# Patient Record
Sex: Female | Born: 1990
Health system: Southern US, Community
[De-identification: ages and names within clinical notes are randomized; demographics above are authoritative.]

## PROBLEM LIST (undated history)

## (undated) DIAGNOSIS — F419 Anxiety disorder, unspecified: Secondary | ICD-10-CM

## (undated) DIAGNOSIS — F329 Major depressive disorder, single episode, unspecified: Secondary | ICD-10-CM

## (undated) DIAGNOSIS — E282 Polycystic ovarian syndrome: Secondary | ICD-10-CM

## (undated) DIAGNOSIS — F32A Depression, unspecified: Secondary | ICD-10-CM

## (undated) HISTORY — DX: Anxiety disorder, unspecified: F41.9

## (undated) HISTORY — DX: Depression, unspecified: F32.A

## (undated) HISTORY — DX: Major depressive disorder, single episode, unspecified: F32.9

## (undated) HISTORY — PX: BRAIN SURGERY: SHX531

## (undated) HISTORY — PX: WISDOM TOOTH EXTRACTION: SHX21

---

## 2008-07-15 ENCOUNTER — Encounter: Admission: RE | Admit: 2008-07-15 | Discharge: 2008-07-15 | Payer: Self-pay | Admitting: Family Medicine

## 2008-10-16 ENCOUNTER — Encounter: Admission: RE | Admit: 2008-10-16 | Discharge: 2008-10-16 | Payer: Self-pay | Admitting: Endocrinology

## 2011-04-07 ENCOUNTER — Emergency Department (HOSPITAL_COMMUNITY): Payer: No Typology Code available for payment source

## 2011-04-07 ENCOUNTER — Emergency Department (HOSPITAL_COMMUNITY)
Admission: EM | Admit: 2011-04-07 | Discharge: 2011-04-07 | Payer: No Typology Code available for payment source | Attending: Emergency Medicine | Admitting: Emergency Medicine

## 2011-04-07 ENCOUNTER — Encounter (HOSPITAL_COMMUNITY): Payer: Self-pay | Admitting: Surgery

## 2011-04-07 DIAGNOSIS — S27329A Contusion of lung, unspecified, initial encounter: Secondary | ICD-10-CM | POA: Insufficient documentation

## 2011-04-07 DIAGNOSIS — S064X9A Epidural hemorrhage with loss of consciousness of unspecified duration, initial encounter: Secondary | ICD-10-CM

## 2011-04-07 DIAGNOSIS — I621 Nontraumatic extradural hemorrhage: Secondary | ICD-10-CM | POA: Insufficient documentation

## 2011-04-07 DIAGNOSIS — R1110 Vomiting, unspecified: Secondary | ICD-10-CM | POA: Insufficient documentation

## 2011-04-07 DIAGNOSIS — S36113A Laceration of liver, unspecified degree, initial encounter: Secondary | ICD-10-CM

## 2011-04-07 LAB — TYPE AND SCREEN
ABO/RH(D): O POS
Antibody Screen: NEGATIVE
Unit division: 0
Unit division: 0

## 2011-04-07 LAB — CBC
HCT: 36.4 % (ref 36.0–46.0)
Hemoglobin: 12.4 g/dL (ref 12.0–15.0)
MCH: 29.2 pg (ref 26.0–34.0)
MCHC: 34.1 g/dL (ref 30.0–36.0)
MCV: 85.8 fL (ref 78.0–100.0)
Platelets: 304 10*3/uL (ref 150–400)
RBC: 4.24 MIL/uL (ref 3.87–5.11)
RDW: 13.1 % (ref 11.5–15.5)
WBC: 8.7 10*3/uL (ref 4.0–10.5)

## 2011-04-07 LAB — BASIC METABOLIC PANEL
BUN: 8 mg/dL (ref 6–23)
CO2: 23 mEq/L (ref 19–32)
Calcium: 8.6 mg/dL (ref 8.4–10.5)
Chloride: 101 mEq/L (ref 96–112)
Creatinine, Ser: 0.43 mg/dL — ABNORMAL LOW (ref 0.50–1.10)
GFR calc Af Amer: >90 mL/min (ref >90)
GFR calc non Af Amer: >90 mL/min (ref >90)
Glucose, Bld: 129 mg/dL — ABNORMAL HIGH (ref 70–99)
Potassium: 2.7 mEq/L — CL (ref 3.5–5.1)
Sodium: 138 mEq/L (ref 135–145)

## 2011-04-07 LAB — POCT I-STAT, CHEM 8
BUN: 7 mg/dL (ref 6–23)
Calcium, Ion: 1.08 mmol/L — ABNORMAL LOW (ref 1.12–1.32)
Chloride: 103 mEq/L (ref 96–112)
Creatinine, Ser: 0.7 mg/dL (ref 0.50–1.10)
Glucose, Bld: 132 mg/dL — ABNORMAL HIGH (ref 70–99)
HCT: 37 % (ref 36.0–46.0)
Hemoglobin: 12.6 g/dL (ref 12.0–15.0)
Potassium: 2.8 mEq/L — ABNORMAL LOW (ref 3.5–5.1)
Sodium: 140 mEq/L (ref 135–145)
TCO2: 22 mmol/L (ref 0–100)

## 2011-04-07 LAB — PROTIME-INR
INR: 1.03 (ref 0.00–1.49)
Prothrombin Time: 13.7 seconds (ref 11.6–15.2)

## 2011-04-07 LAB — ETHANOL: Alcohol, Ethyl (B): 53 mg/dL — ABNORMAL HIGH (ref 0–11)

## 2011-04-07 LAB — ABO/RH: ABO/RH(D): O POS

## 2011-04-07 MED ORDER — ROCURONIUM BROMIDE 50 MG/5ML IV SOLN
0.6000 mg/kg | Freq: Once | INTRAVENOUS | Status: AC
  Administered 2011-04-07: 10 mg via INTRAVENOUS

## 2011-04-07 MED ORDER — MIDAZOLAM HCL 2 MG/2ML IJ SOLN
6.0000 mg | Freq: Once | INTRAMUSCULAR | Status: AC
  Administered 2011-04-07: 6 mg via INTRAVENOUS

## 2011-04-07 MED ORDER — ONDANSETRON HCL 4 MG/2ML IJ SOLN
4.0000 mg | Freq: Once | INTRAMUSCULAR | Status: AC
  Administered 2011-04-07: 4 mg via INTRAVENOUS

## 2011-04-07 MED ORDER — HALOPERIDOL LACTATE 5 MG/ML IJ SOLN: 5.0000 mg | Freq: Once | INTRAMUSCULAR | Status: DC

## 2011-04-07 MED ORDER — LORAZEPAM 2 MG/ML IJ SOLN: 2.0000 mg | Freq: Once | INTRAMUSCULAR | Status: DC

## 2011-04-07 MED ORDER — FENTANYL CITRATE 0.05 MG/ML IJ SOLN
100.0000 ug | Freq: Once | INTRAMUSCULAR | Status: AC
  Administered 2011-04-07: 100 ug via INTRAVENOUS

## 2011-04-07 MED ORDER — HALOPERIDOL LACTATE 5 MG/ML IJ SOLN
INTRAMUSCULAR | Status: AC
  Administered 2011-04-07: 5 mg via INTRAVENOUS
  Filled 2011-04-07: qty 1

## 2011-04-07 MED ORDER — ETOMIDATE 2 MG/ML IV SOLN
25.0000 mg | Freq: Once | INTRAVENOUS | Status: AC
  Administered 2011-04-07: 20 mg via INTRAVENOUS

## 2011-04-07 MED ORDER — IOHEXOL 300 MG/ML  SOLN
100.0000 mL | Freq: Once | INTRAMUSCULAR | Status: AC | PRN
  Administered 2011-04-07: 100 mL via INTRAVENOUS

## 2011-04-07 NOTE — ED Notes (Signed)
Dr. Patria Mane arrives.

## 2011-04-07 NOTE — ED Notes (Signed)
Family arrived in ED waiting. Chaplain paged.
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
## 2011-04-07 NOTE — ED Notes (Signed)
Pharmacy called for propafol
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
## 2011-04-07 NOTE — ED Notes (Signed)
MD at bedside. 

## 2011-04-07 NOTE — ED Notes (Signed)
ems arrives for emergent transfer to [facility]

## 2011-04-07 NOTE — ED Notes (Signed)
Dr Davina Poke at bedside. Team prepares to intubate. Dr Patria Mane orders 20mg  etomodate to be drawn up and 70 of rocuronium
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
## 2011-04-07 NOTE — ED Notes (Signed)
Pt very combative. Dr Patria Mane orders 5 mg haldol iv and discontinue efforts on second iv for now.

## 2011-04-07 NOTE — ED Notes (Signed)
To ct

## 2011-04-07 NOTE — ED Notes (Signed)
Pt calm at this time. Scan underway. Dr Patria Mane advised hold ativan for now.

## 2011-04-07 NOTE — ED Notes (Signed)
Pt combative on CT table. Dr. Patria Mane called for orders. He advised he is en route.
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
## 2011-04-20 MED FILL — Lorazepam Inj 2 MG/ML: INTRAMUSCULAR | Qty: 1 | Status: AC
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
## 2014-06-26 ENCOUNTER — Ambulatory Visit: Payer: 59 | Admitting: Neurology

## 2015-02-12 ENCOUNTER — Ambulatory Visit: Payer: 59 | Admitting: Neurology

## 2015-05-05 ENCOUNTER — Telehealth: Payer: Self-pay | Admitting: *Deleted

## 2015-05-05 ENCOUNTER — Ambulatory Visit: Payer: 59 | Admitting: Neurology

## 2015-05-05 NOTE — Telephone Encounter (Signed)
No showed new patient appointment. 

## 2015-05-06 ENCOUNTER — Encounter: Payer: Self-pay | Admitting: Neurology

## 2015-06-05 ENCOUNTER — Emergency Department (HOSPITAL_COMMUNITY)
Admission: EM | Admit: 2015-06-05 | Discharge: 2015-06-06 | Disposition: A | Discharge status: Home / Self Care | Payer: BLUE CROSS/BLUE SHIELD | Attending: Emergency Medicine | Admitting: Emergency Medicine

## 2015-06-05 ENCOUNTER — Encounter (HOSPITAL_COMMUNITY): Payer: Self-pay

## 2015-06-05 ENCOUNTER — Emergency Department (HOSPITAL_COMMUNITY): Payer: BLUE CROSS/BLUE SHIELD

## 2015-06-05 DIAGNOSIS — Z841 Family history of disorders of kidney and ureter: Secondary | ICD-10-CM | POA: Insufficient documentation

## 2015-06-05 DIAGNOSIS — Z79899 Other long term (current) drug therapy: Secondary | ICD-10-CM | POA: Diagnosis not present

## 2015-06-05 DIAGNOSIS — Z793 Long term (current) use of hormonal contraceptives: Secondary | ICD-10-CM | POA: Insufficient documentation

## 2015-06-05 DIAGNOSIS — N132 Hydronephrosis with renal and ureteral calculous obstruction: Secondary | ICD-10-CM | POA: Insufficient documentation

## 2015-06-05 DIAGNOSIS — N200 Calculus of kidney: Secondary | ICD-10-CM

## 2015-06-05 DIAGNOSIS — E282 Polycystic ovarian syndrome: Secondary | ICD-10-CM | POA: Diagnosis not present

## 2015-06-05 DIAGNOSIS — R319 Hematuria, unspecified: Secondary | ICD-10-CM | POA: Diagnosis present

## 2015-06-05 DIAGNOSIS — R109 Unspecified abdominal pain: Secondary | ICD-10-CM

## 2015-06-05 HISTORY — DX: Polycystic ovarian syndrome: E28.2

## 2015-06-05 LAB — BASIC METABOLIC PANEL
Anion gap: 11 (ref 5–15)
BUN: 15 mg/dL (ref 6–20)
CO2: 26 mmol/L (ref 22–32)
Calcium: 9.5 mg/dL (ref 8.9–10.3)
Chloride: 102 mmol/L (ref 101–111)
Creatinine, Ser: 0.69 mg/dL (ref 0.44–1.00)
GFR calc Af Amer: >60 mL/min (ref >60)
GFR calc non Af Amer: >60 mL/min (ref >60)
Glucose, Bld: 85 mg/dL (ref 65–99)
Potassium: 4.1 mmol/L (ref 3.5–5.1)
Sodium: 139 mmol/L (ref 135–145)

## 2015-06-05 LAB — CBC WITH DIFFERENTIAL/PLATELET
Basophils Absolute: 0 10*3/uL (ref 0.0–0.1)
Basophils Relative: 0 %
Eosinophils Absolute: 0.1 10*3/uL (ref 0.0–0.7)
Eosinophils Relative: 1 %
HCT: 42.7 % (ref 36.0–46.0)
Hemoglobin: 14.4 g/dL (ref 12.0–15.0)
Lymphocytes Relative: 26 %
Lymphs Abs: 1.9 10*3/uL (ref 0.7–4.0)
MCH: 29.4 pg (ref 26.0–34.0)
MCHC: 33.7 g/dL (ref 30.0–36.0)
MCV: 87.1 fL (ref 78.0–100.0)
Monocytes Absolute: 0.7 10*3/uL (ref 0.1–1.0)
Monocytes Relative: 10 %
Neutro Abs: 4.6 10*3/uL (ref 1.7–7.7)
Neutrophils Relative %: 63 %
Platelets: 302 10*3/uL (ref 150–400)
RBC: 4.9 MIL/uL (ref 3.87–5.11)
RDW: 13 % (ref 11.5–15.5)
WBC: 7.3 10*3/uL (ref 4.0–10.5)

## 2015-06-05 LAB — URINALYSIS, ROUTINE W REFLEX MICROSCOPIC
Glucose, UA: NEGATIVE mg/dL
Hgb urine dipstick: NEGATIVE
Ketones, ur: 15 mg/dL — AB
Nitrite: POSITIVE — AB
Protein, ur: NEGATIVE mg/dL
Specific Gravity, Urine: 1.015 (ref 1.005–1.030)
pH: 5 (ref 5.0–8.0)

## 2015-06-05 LAB — URINE MICROSCOPIC-ADD ON

## 2015-06-05 LAB — I-STAT BETA HCG BLOOD, ED (MC, WL, AP ONLY): I-stat hCG, quantitative: <5 m[IU]/mL (ref <5)

## 2015-06-05 MED ORDER — KETOROLAC TROMETHAMINE 30 MG/ML IJ SOLN
15.0000 mg | Freq: Once | INTRAMUSCULAR | Status: AC
  Administered 2015-06-05: 15 mg via INTRAVENOUS
  Filled 2015-06-05: qty 1

## 2015-06-05 MED ORDER — KETOROLAC TROMETHAMINE 30 MG/ML IJ SOLN: 15.0000 mg | Freq: Once | INTRAMUSCULAR | Status: DC

## 2015-06-05 MED ORDER — HYDROMORPHONE HCL 2 MG/ML IJ SOLN
2.0000 mg | INTRAMUSCULAR | Status: DC | PRN
  Administered 2015-06-05: 1 mg via INTRAVENOUS
  Administered 2015-06-05: 2 mg via INTRAVENOUS
  Filled 2015-06-05: qty 1

## 2015-06-05 MED ORDER — FENTANYL CITRATE (PF) 100 MCG/2ML IJ SOLN
100.0000 ug | Freq: Once | INTRAMUSCULAR | Status: AC
  Administered 2015-06-05: 100 ug via INTRAVENOUS
  Filled 2015-06-05: qty 2

## 2015-06-05 MED ORDER — SODIUM CHLORIDE 0.9 % IV SOLN: INTRAVENOUS | Status: DC

## 2015-06-05 MED ORDER — HYDROMORPHONE HCL 1 MG/ML IJ SOLN
1.0000 mg | Freq: Once | INTRAMUSCULAR | Status: AC
Start: 2015-06-05 — End: 2015-06-05
  Administered 2015-06-05: 1 mg via INTRAVENOUS
  Filled 2015-06-05: qty 1

## 2015-06-05 MED ORDER — HYDROMORPHONE HCL 1 MG/ML IJ SOLN: 1.0000 mg | INTRAMUSCULAR | Status: AC

## 2015-06-05 NOTE — ED Notes (Signed)
PT in CT.

## 2015-06-05 NOTE — ED Notes (Signed)
I wasted with doctor beaton

## 2015-06-05 NOTE — ED Notes (Signed)
Patient c/o right pelvic, right flank pain, urinary frequency, dysuria, and hematuria. Patient states she went an OB/GYN physician and was being treated for an UTI and today she was told she  Did not have a UTI

## 2015-06-05 NOTE — ED Provider Notes (Signed)
CSN: [id number]     Arrival date & time 06/05/15  1446 History   First MD Initiated Contact with Patient 06/05/15 1941     Chief Complaint  Patient presents with  . Hematuria  . Flank Pain  . Pelvic Pain      HPI Patient c/o right pelvic, right flank pain, urinary frequency, dysuria, and hematuria. Patient states she went an OB/GYN physician and was being treated for an UTI and today she was told she Did not have a UTI Past Medical History  Diagnosis Date  . Polycystic ovarian syndrome    Past Surgical History  Procedure Laterality Date  . Brain surgery    . Wisdom tooth extraction     Family History  Problem Relation Age of Onset  . Kidney Stones Father    Social History  Substance Use Topics  . Smoking status: Never Smoker   . Smokeless tobacco: Never Used  . Alcohol Use: Yes     Comment: rare   OB History    No data available     Review of Systems  All other systems reviewed and are negative.     Allergies  Review of patient's allergies indicates no known allergies.  Home Medications   Prior to Admission medications   Medication Sig Start Date End Date Taking? Authorizing Provider  Cranberry-Vitamin C-Probiotic (AZO CRANBERRY) 250-30 MG TABS Take 1 tablet by mouth 2 (two) times daily.    Yes Historical Provider, MD  nitrofurantoin, macrocrystal-monohydrate, (MACROBID) 100 MG capsule Take 100 mg by mouth 2 (two) times daily. ABT Start Date 06/03/15 & End Date 06/09/15. 06/03/15  Yes Historical Provider, MD  norgestrel-ethinyl estradiol (CRYSELLE-28) 0.3-30 MG-MCG tablet Take 1 tablet by mouth daily.   Yes Historical Provider, MD  phenazopyridine (PYRIDIUM) 100 MG tablet Take 100 mg by mouth 3 (three) times daily as needed for pain.   Yes Historical Provider, MD  TAZORAC 0.1 % cream Apply 1 application topically at bedtime.  05/29/15  Yes Historical Provider, MD   BP 113/78 mmHg  Pulse 91  Temp(Src) 98.6 F (37 C) (Oral)  Resp 20  Ht 5\' 6"  (1.676 m)  Wt 135  lb (61.236 kg)  BMI 21.80 kg/m2  SpO2 99%  LMP 05/11/2015 Physical Exam  Constitutional: She is oriented to person, place, and time. She appears well-developed and well-nourished. She appears distressed.  HENT:  Head: Normocephalic and atraumatic.  Eyes: Pupils are equal, round, and reactive to light.  Neck: Normal range of motion.  Cardiovascular: Normal rate and intact distal pulses.   Pulmonary/Chest: No respiratory distress.  Abdominal: Normal appearance. She exhibits no distension.    Musculoskeletal: Normal range of motion.  Neurological: She is alert and oriented to person, place, and time. No cranial nerve deficit.  Skin: Skin is warm and dry. No rash noted.  Psychiatric: She has a normal mood and affect. Her behavior is normal.  Nursing note and vitals reviewed.   ED Course  Procedures (including critical care time) Medications  HYDROmorphone (DILAUDID) injection 2 mg (1 mg Intravenous Given 06/05/15 2325)  0.9 %  sodium chloride infusion (not administered)  ketorolac (TORADOL) 30 MG/ML injection 15 mg (15 mg Intravenous Given 06/05/15 2052)  fentaNYL (SUBLIMAZE) injection 100 mcg (100 mcg Intravenous Given 06/05/15 2037)  HYDROmorphone (DILAUDID) injection 1 mg (1 mg Intravenous Given 06/05/15 2145)    Labs Review Labs Reviewed  URINALYSIS, ROUTINE W REFLEX MICROSCOPIC (NOT [facility]) - Abnormal; Notable for the following:    Color,  Urine ORANGE (*)    APPearance CLOUDY (*)    Bilirubin Urine SMALL (*)    Ketones, ur 15 (*)    Nitrite POSITIVE (*)    Leukocytes, UA SMALL (*)    All other components within normal limits  URINE MICROSCOPIC-ADD ON - Abnormal; Notable for the following:    Squamous Epithelial / LPF 6-30 (*)    Bacteria, UA FEW (*)    All other components within normal limits  URINE CULTURE  CBC WITH DIFFERENTIAL/PLATELET  BASIC METABOLIC PANEL  I-STAT BETA HCG BLOOD, ED ([facility], [facility], [facility] ONLY)    Imaging Review Ct Renal Stone Study  06/05/2015   CLINICAL DATA:  Acute onset of pelvic pain and right flank pain. Hematuria. Initial encounter. EXAM: CT ABDOMEN AND PELVIS WITHOUT CONTRAST TECHNIQUE: Multidetector CT imaging of the abdomen and pelvis was performed following the standard protocol without IV contrast. COMPARISON:  None. FINDINGS: The visualized lung bases are clear. The liver and spleen are unremarkable in appearance. The gallbladder is within normal limits. The pancreas and adrenal glands are unremarkable. There is mild right-sided hydronephrosis, with prominence of the right ureter along its entire course. An obstructing large 7 x 5 mm stone is noted at the distal right ureter, just above the right vesicoureteral junction. The left kidney is grossly unremarkable in appearance. No nonobstructing renal stones are identified. No free fluid is identified. The small bowel is unremarkable in appearance. The stomach is within normal limits. No acute vascular abnormalities are seen. The appendix is not definitely characterized; there is no evidence of appendicitis. The colon is unremarkable in appearance. The bladder is relatively decompressed and not well assessed. The uterus is grossly unremarkable in appearance. The ovaries are difficult to fully characterize. No suspicious adnexal masses are seen. No inguinal lymphadenopathy is seen. No acute osseous abnormalities are identified. IMPRESSION: Mild right-sided hydronephrosis, with an obstructing large 7 x 5 mm stone in the distal right ureter, just above the right vesicoureteral junction. Electronically Signed   By: Roanna Raider M.D.   On: 06/05/2015 21:21   I have personally reviewed and evaluated these images and lab results as part of my medical decision-making.   EKG Interpretation None     I discussed with the urologist on-call.  He is coming to the emergency room to evaluate her for possible surgery. MDM   Final diagnoses:  Flank pain  Right flank pain        Nelva Nay,  MD 06/10/15 2212

## 2015-06-05 NOTE — H&P (Signed)
I have been asked to see the patient by Dr. Nelva Nay, for evaluation and management of right distal ureteral stone.  History of present illness: 89F with h/o 12 hours of renal colic.  Her pain has progressed while in the ED.  Her pain is in the right groin with radiation to her right flank.  She has associated voiding symptoms including urgency, frequency, and dark urine.  She was seen and treated for a UTI by her GYN two days prior.  She is also taking AZO. In the ED she was afebrile with normal labs.  Her urine was concerning in that she has nitrite positive.  She was afebrile.  She had a CT scan that demonstrated a 4.8 x 6.63mm stone at the right UVJ.  She had several doses of dilaudid and toradol without significant relief.  Review of systems: A 12 point comprehensive review of systems was obtained and is negative unless otherwise stated in the history of present illness.  There are no active problems to display for this patient.   No current facility-administered medications on file prior to encounter.   No current outpatient prescriptions on file prior to encounter.    Past Medical History  Diagnosis Date  . Polycystic ovarian syndrome     Past Surgical History  Procedure Laterality Date  . Brain surgery    . Wisdom tooth extraction      Social History  Substance Use Topics  . Smoking status: Never Smoker   . Smokeless tobacco: Never Used  . Alcohol Use: Yes     Comment: rare    Family History  Problem Relation Age of Onset  . Kidney Stones Father     PE: Filed Vitals:   06/05/15 2230 06/05/15 2300 06/05/15 2309 06/05/15 2330  BP: 118/80 130/83 118/80 113/78  Pulse: 73 78 73 91  Temp:   98.6 F (37 C)   TempSrc:      Resp:   20   Height:      Weight:      SpO2: 100% 99%  99%   Patient appears to be in no acute distress  patient is alert and oriented x3 Atraumatic normocephalic head No cervical or supraclavicular lymphadenopathy appreciated No  increased work of breathing, no audible wheezes/rhonchi Regular sinus rhythm/rate Abdomen is soft, nontender, nondistended, no CVA or suprapubic tenderness Lower extremities are symmetric without appreciable edema Grossly neurologically intact No identifiable skin lesions   Recent Labs  06/05/15 2011  WBC 7.3  HGB 14.4  HCT 42.7    Recent Labs  06/05/15 2011  NA 139  K 4.1  CL 102  CO2 26  GLUCOSE 85  BUN 15  CREATININE 0.69  CALCIUM 9.5   No results for input(s): LABPT, INR in the last 72 hours. No results for input(s): LABURIN in the last 72 hours. No results found for this or any previous visit.  Imaging: I have independently reviewed her CT scan - right distal  UVJ stone with proximal hydronephrosis.  No additional stones.  Imp: Right distal ureteral stone with poorly controlled pain and UA suspicious for UTI.  Recommendations: Plan to proceed to the OR for ureteroscopy and stent placement.  I discussed the surgery with her in detail.  She understands that there are times that we are not able to get into the ureter and would require a stent and a second look.  She understands that she will have a stent placed and that there are associated voiding symptoms  with a stent.  She understands the risk of general anesthesia.   Berniece SalinesHERRICK, BENJAMIN W

## 2015-06-06 ENCOUNTER — Encounter (HOSPITAL_COMMUNITY): Payer: Self-pay | Admitting: Anesthesiology

## 2015-06-06 ENCOUNTER — Encounter (HOSPITAL_COMMUNITY)
Admission: EM | Disposition: A | Discharge status: Home / Self Care | Payer: Self-pay | Source: Home / Self Care | Attending: Emergency Medicine

## 2015-06-06 ENCOUNTER — Emergency Department (HOSPITAL_COMMUNITY): Payer: BLUE CROSS/BLUE SHIELD | Admitting: Anesthesiology

## 2015-06-06 ENCOUNTER — Emergency Department (HOSPITAL_COMMUNITY): Payer: BLUE CROSS/BLUE SHIELD

## 2015-06-06 HISTORY — PX: CYSTOSCOPY WITH RETROGRADE PYELOGRAM, URETEROSCOPY AND STENT PLACEMENT: SHX5789

## 2015-06-06 SURGERY — CYSTOURETEROSCOPY, WITH RETROGRADE PYELOGRAM AND STENT INSERTION
Anesthesia: General | Site: Ureter | Laterality: Right

## 2015-06-06 MED ORDER — TRAMADOL HCL 50 MG PO TABS: 50.0000 mg | ORAL_TABLET | Freq: Four times a day (QID) | ORAL | Status: DC | PRN

## 2015-06-06 MED ORDER — FENTANYL CITRATE (PF) 100 MCG/2ML IJ SOLN
INTRAMUSCULAR | Status: AC
  Filled 2015-06-06: qty 2

## 2015-06-06 MED ORDER — PROMETHAZINE HCL 25 MG/ML IJ SOLN
INTRAMUSCULAR | Status: AC
  Administered 2015-06-06: 12.5 mg via INTRAVENOUS
  Filled 2015-06-06: qty 1

## 2015-06-06 MED ORDER — CIPROFLOXACIN HCL 500 MG PO TABS: 500.0000 mg | ORAL_TABLET | Freq: Once | ORAL | Status: DC

## 2015-06-06 MED ORDER — BELLADONNA ALKALOIDS-OPIUM 16.2-60 MG RE SUPP
RECTAL | Status: AC
  Filled 2015-06-06: qty 1

## 2015-06-06 MED ORDER — HYDROCODONE-ACETAMINOPHEN 7.5-325 MG PO TABS
1.0000 | ORAL_TABLET | Freq: Once | ORAL | Status: AC | PRN
  Administered 2015-06-06: 1 via ORAL

## 2015-06-06 MED ORDER — PROPOFOL 10 MG/ML IV BOLUS
INTRAVENOUS | Status: AC
  Filled 2015-06-06: qty 20

## 2015-06-06 MED ORDER — BELLADONNA ALKALOIDS-OPIUM 16.2-60 MG RE SUPP
RECTAL | Status: DC | PRN
  Administered 2015-06-06: 1 via RECTAL

## 2015-06-06 MED ORDER — ONDANSETRON HCL 4 MG/2ML IJ SOLN
INTRAMUSCULAR | Status: AC
  Filled 2015-06-06: qty 2

## 2015-06-06 MED ORDER — LIDOCAINE HCL (CARDIAC) 20 MG/ML IV SOLN
INTRAVENOUS | Status: DC | PRN
  Administered 2015-06-06: 100 mg via INTRAVENOUS

## 2015-06-06 MED ORDER — LIDOCAINE HCL (CARDIAC) 20 MG/ML IV SOLN
INTRAVENOUS | Status: AC
  Filled 2015-06-06: qty 5

## 2015-06-06 MED ORDER — SUCCINYLCHOLINE CHLORIDE 20 MG/ML IJ SOLN
INTRAMUSCULAR | Status: DC | PRN
  Administered 2015-06-06: 100 mg via INTRAVENOUS

## 2015-06-06 MED ORDER — ONDANSETRON HCL 4 MG/2ML IJ SOLN
INTRAMUSCULAR | Status: DC | PRN
  Administered 2015-06-06: 4 mg via INTRAVENOUS

## 2015-06-06 MED ORDER — PROPOFOL 10 MG/ML IV BOLUS
INTRAVENOUS | Status: DC | PRN
Start: 2015-06-06 — End: 2015-06-06
  Administered 2015-06-06: 150 mg via INTRAVENOUS

## 2015-06-06 MED ORDER — MIDAZOLAM HCL 2 MG/2ML IJ SOLN
INTRAMUSCULAR | Status: AC
  Filled 2015-06-06: qty 2

## 2015-06-06 MED ORDER — HYDROMORPHONE HCL 1 MG/ML IJ SOLN: 0.2500 mg | INTRAMUSCULAR | Status: DC | PRN

## 2015-06-06 MED ORDER — IOHEXOL 300 MG/ML  SOLN
INTRAMUSCULAR | Status: DC | PRN
  Administered 2015-06-06: 8 mL via URETHRAL

## 2015-06-06 MED ORDER — CIPROFLOXACIN IN D5W 400 MG/200ML IV SOLN
INTRAVENOUS | Status: AC
  Filled 2015-06-06: qty 200

## 2015-06-06 MED ORDER — HYDROMORPHONE HCL 1 MG/ML IJ SOLN
INTRAMUSCULAR | Status: AC
  Filled 2015-06-06: qty 1

## 2015-06-06 MED ORDER — TROSPIUM CHLORIDE ER 60 MG PO CP24: 60.0000 mg | ORAL_CAPSULE | Freq: Every day | ORAL | Status: DC

## 2015-06-06 MED ORDER — CIPROFLOXACIN IN D5W 400 MG/200ML IV SOLN
INTRAVENOUS | Status: DC | PRN
  Administered 2015-06-06: 400 mg via INTRAVENOUS

## 2015-06-06 MED ORDER — PROMETHAZINE HCL 25 MG/ML IJ SOLN
6.2500 mg | INTRAMUSCULAR | Status: DC | PRN
  Administered 2015-06-06: 12.5 mg via INTRAVENOUS

## 2015-06-06 MED ORDER — DEXAMETHASONE SODIUM PHOSPHATE 10 MG/ML IJ SOLN
INTRAMUSCULAR | Status: DC | PRN
  Administered 2015-06-06: 10 mg via INTRAVENOUS

## 2015-06-06 MED ORDER — LACTATED RINGERS IV SOLN
INTRAVENOUS | Status: DC | PRN
  Administered 2015-06-06: 01:00:00 via INTRAVENOUS

## 2015-06-06 MED ORDER — SODIUM CHLORIDE 0.9 % IR SOLN
Status: DC | PRN
  Administered 2015-06-06: 3000 mL via INTRAVESICAL

## 2015-06-06 MED ORDER — HYDROCODONE-ACETAMINOPHEN 7.5-325 MG PO TABS
ORAL_TABLET | ORAL | Status: AC
  Administered 2015-06-06: 1 via ORAL
  Filled 2015-06-06: qty 1

## 2015-06-06 MED ORDER — DEXAMETHASONE SODIUM PHOSPHATE 10 MG/ML IJ SOLN
INTRAMUSCULAR | Status: AC
  Filled 2015-06-06: qty 1

## 2015-06-06 MED ORDER — MIDAZOLAM HCL 5 MG/5ML IJ SOLN
INTRAMUSCULAR | Status: DC | PRN
  Administered 2015-06-06: 2 mg via INTRAVENOUS

## 2015-06-06 MED ORDER — PHENAZOPYRIDINE HCL 200 MG PO TABS: 200.0000 mg | ORAL_TABLET | Freq: Three times a day (TID) | ORAL | Status: DC | PRN

## 2015-06-06 MED ORDER — FENTANYL CITRATE (PF) 100 MCG/2ML IJ SOLN
INTRAMUSCULAR | Status: DC | PRN
  Administered 2015-06-06: 100 ug via INTRAVENOUS

## 2015-06-06 SURGICAL SUPPLY — 17 items
BAG URO CATCHER STRL LF (MISCELLANEOUS) ×2 IMPLANT
BASKET DAKOTA 1.9FR 11X120 (BASKET) ×2 IMPLANT
BASKET ZERO TIP NITINOL 2.4FR (BASKET) IMPLANT
CATH URET 5FR 28IN OPEN ENDED (CATHETERS) ×2 IMPLANT
CLOTH BEACON ORANGE TIMEOUT ST (SAFETY) ×2 IMPLANT
FIBER LASER FLEXIVA 365 (UROLOGICAL SUPPLIES) ×2 IMPLANT
GLOVE BIOGEL M STRL SZ7.5 (GLOVE) ×2 IMPLANT
GOWN STRL REUS W/TWL XL LVL3 (GOWN DISPOSABLE) ×2 IMPLANT
GUIDEWIRE ANG ZIPWIRE 038X150 (WIRE) IMPLANT
GUIDEWIRE STR DUAL SENSOR (WIRE) ×2 IMPLANT
MANIFOLD NEPTUNE II (INSTRUMENTS) ×2 IMPLANT
PACK CYSTO (CUSTOM PROCEDURE TRAY) ×2 IMPLANT
SHEATH ACCESS URETERAL 24CM (SHEATH) IMPLANT
SHEATH ACCESS URETERAL 38CM (SHEATH) IMPLANT
SHEATH ACCESS URETERAL 54CM (SHEATH) IMPLANT
STENT CONTOUR 6FRX26X.038 (STENTS) ×2 IMPLANT
TUBING CONNECTING 10 (TUBING) ×2 IMPLANT

## 2015-06-06 NOTE — Transfer of Care (Signed)
Immediate Anesthesia Transfer of Care Note  Patient: Samantha Larson  Procedure(s) Performed: Procedure(s): CYSTOSCOPY WITH RETROGRADE PYELOGRAM, URETEROSCOPY AND STENT PLACEMENT with LASER  (Right)  Patient Location: PACU  Anesthesia Type:General  Level of Consciousness: sedated  Airway & Oxygen Therapy: Patient Spontanous Breathing and Patient connected to face mask oxygen  Post-op Assessment: Report given to RN and Post -op Vital signs reviewed and stable  Post vital signs: Reviewed and stable  Last Vitals:  Filed Vitals:   06/05/15 2353 06/06/15 0000  BP: 113/78 113/65  Pulse: 92 87  Temp:    Resp: 17     Complications: No apparent anesthesia complications

## 2015-06-06 NOTE — Anesthesia Procedure Notes (Signed)
Procedure Name: Intubation Date/Time: 06/06/2015 12:53 AM Performed by: Junious SilkGILBERT, Sherel Fennell Pre-anesthesia Checklist: Patient identified, Emergency Drugs available, Suction available, Patient being monitored and Timeout performed Patient Re-evaluated:Patient Re-evaluated prior to inductionOxygen Delivery Method: Circle system utilized Preoxygenation: Pre-oxygenation with 100% oxygen Intubation Type: IV induction and Rapid sequence Laryngoscope Size: Miller and 2 Grade View: Grade I Tube type: Oral Tube size: 7.0 mm Number of attempts: 1 Airway Equipment and Method: Stylet Placement Confirmation: ETT inserted through vocal cords under direct vision,  positive ETCO2,  CO2 detector and breath sounds checked- equal and bilateral Secured at: 21 cm Tube secured with: Tape Dental Injury: Teeth and Oropharynx as per pre-operative assessment

## 2015-06-06 NOTE — Discharge Instructions (Signed)
DISCHARGE INSTRUCTIONS FOR KIDNEY STONE/URETERAL STENT   MEDICATIONS:  1. Resume all your other meds from home - except do not take any extra narcotic pain meds that you may have at home.  2. Trospium is to prevent bladder spasms and help reduce urinary frequency. 3. Pyridium is to help with the burning/stinging when you urinate. 4. Tramadol is for moderate/severe pain, otherwise taking upto 1000mg  every 6 hours of plainTylenol will help treat your pain.  Do not take both at the same time. 5. Take Cipro one hour prior to removal of your stent.   ACTIVITY:  1. No strenuous activity x 1week  2. No driving while on narcotic pain medications  3. Drink plenty of water  4. Continue to walk at home - you can still get blood clots when you are at home, so keep active, but don't over do it.  5. May return to work/school tomorrow or when you feel ready   BATHING:  1. You can shower and we recommend daily showers  2. You have a string coming from your urethra: The stent string is attached to your ureteral stent. Do not pull on this.   SIGNS/SYMPTOMS TO CALL:  Please call us if you have a fever greater than 101.5, uncontrolled nausea/vomiting, uncontrolled pain, dizziness, unable to urinate, bloody urine, chest pain, shortness of breath, leg swelling, leg pain, redness around wound, drainage from wound, or any other concerns or questions.  Ok to return to work if no temperature or pain on Tuesday, June 09, 2015 You can reach us at [phone].   FOLLOW-UP:  1. You have an appointment in 6 weeks with a ultrasound of your kidneys prior.  2. You have a string attached to your stent, you may remove it on Jan 16th. To do this, pull the strings until the stents are completely removed. You may feel an odd sensation in your back.

## 2015-06-06 NOTE — Op Note (Signed)
Preoperative diagnosis: right ureteral calculus  Postoperative diagnosis: right ureteral calculus  Procedure:  1. Cystoscopy 2. right ureteroscopy and stone removal 3. Ureteroscopic laser lithotripsy 4. right 38F x 26 ureteral stent placement  5. right retrograde pyelography with interpretation  Surgeon: Crist FatBenjamin W. Herrick, MD  Anesthesia: General  Complications: None  Intraoperative findings: right retrograde pyelography demonstrated a filling defect within the right ureter consistent with the patient's known calculus without other abnormalities.  EBL: Minimal  Specimens: 1. right ureteral calculus  Disposition of specimens: [facility] for stone analysis  Indication: Samantha Larson is a 25 y.o.   patient with urolithiasis. After reviewing the management options for treatment, the patient elected to proceed with the above surgical procedure(s). We have discussed the potential benefits and risks of the procedure, side effects of the proposed treatment, the likelihood of the patient achieving the goals of the procedure, and any potential problems that might occur during the procedure or recuperation. Informed consent has been obtained.  Description of procedure:  The patient was taken to the operating room and general anesthesia was induced.  The patient was placed in the dorsal lithotomy position, prepped and draped in the usual sterile fashion, and preoperative antibiotics were administered. A preoperative time-out was performed.   Cystourethroscopy was performed.  The patient's urethra was examined and was normal. The bladder was then systematically examined in its entirety. There was no evidence for any bladder tumors, stones, or other mucosal pathology.    Attention then turned to the right ureteral orifice and a ureteral catheter was used to intubate the ureteral orifice.  Omnipaque contrast was injected through the ureteral catheter and a retrograde  pyelogram was performed with findings as dictated above.  A 0.38 sensor guidewire was then advanced up the right ureter into the renal pelvis under fluoroscopic guidance. The 6 Fr semirigid ureteroscope was then advanced into the ureter next to the guidewire and the calculus was identified.   The stone was then fragmented with the 365 micron holmium laser fiber on a setting of 0.6 and frequency of 6 Hz.   All stones were then removed from the ureter with an N-gage nitinol basket.  Reinspection of the ureter revealed no remaining visible stones or fragments.   The wire was then backloaded through the cystoscope and a ureteral stent was advance over the wire using Seldinger technique.  The stent was positioned appropriately under fluoroscopic and cystoscopic guidance.  The wire was then removed with an adequate stent curl noted in the renal pelvis as well as in the bladder.  The bladder was then emptied and the procedure ended.  The patient appeared to tolerate the procedure well and without complications.  The patient was able to be awakened and transferred to the recovery unit in satisfactory condition.   Disposition: The tether of the stent was left on and tucked inside the patient's vagina.  Instructions for removing the stent have been provided to the patient. This has been scheduled for followup in 6 weeks with a renal ultrasound.

## 2015-06-06 NOTE — Anesthesia Preprocedure Evaluation (Signed)
Anesthesia Evaluation  Patient identified by MRN, date of birth, ID band Patient awake    Reviewed: Allergy & Precautions, NPO status , Patient's Chart, lab work & pertinent test results  Airway Mallampati: I  TM Distance: >3 FB Neck ROM: Full    Dental  (+) Dental Advisory Given, Teeth Intact   Pulmonary neg pulmonary ROS,    breath sounds clear to auscultation       Cardiovascular negative cardio ROS   Rhythm:Regular Rate:Normal     Neuro/Psych Hx TBI with impaired short term memory as only symptom.    GI/Hepatic negative GI ROS, Neg liver ROS,   Endo/Other  negative endocrine ROS  Renal/GU negative Renal ROS     Musculoskeletal   Abdominal   Peds  Hematology negative hematology ROS (+)   Anesthesia Other Findings   Reproductive/Obstetrics                             Lab Results  Component Value Date   WBC 7.3 06/05/2015   HGB 14.4 06/05/2015   HCT 42.7 06/05/2015   MCV 87.1 06/05/2015   PLT 302 06/05/2015   Lab Results  Component Value Date   CREATININE 0.69 06/05/2015   BUN 15 06/05/2015   NA 139 06/05/2015   K 4.1 06/05/2015   CL 102 06/05/2015   CO2 26 06/05/2015    Anesthesia Physical Anesthesia Plan  ASA: II  Anesthesia Plan: General   Post-op Pain Management:    Induction: Intravenous  Airway Management Planned: Oral ETT  Additional Equipment:   Intra-op Plan:   Post-operative Plan: Extubation in OR  Informed Consent: I have reviewed the patients History and Physical, chart, labs and discussed the procedure including the risks, benefits and alternatives for the proposed anesthesia with the patient or authorized representative who has indicated his/her understanding and acceptance.   Dental advisory given  Plan Discussed with: CRNA  Anesthesia Plan Comments:         Anesthesia Quick Evaluation

## 2015-06-07 LAB — URINE CULTURE: Culture: NO GROWTH

## 2015-06-07 NOTE — Anesthesia Postprocedure Evaluation (Signed)
Anesthesia Post Note  Patient: Herb GraysCaitlin L Alia  Procedure(s) Performed: Procedure(s) (LRB): CYSTOSCOPY WITH RETROGRADE PYELOGRAM, URETEROSCOPY AND STENT PLACEMENT with LASER  (Right)  Patient location during evaluation: PACU Anesthesia Type: General Level of consciousness: awake and alert Pain management: pain level controlled Vital Signs Assessment: post-procedure vital signs reviewed and stable Respiratory status: spontaneous breathing Cardiovascular status: blood pressure returned to baseline Anesthetic complications: no    Last Vitals:  Filed Vitals:   06/06/15 0230 06/06/15 0300  BP: 116/80 112/79  Pulse: 68 66  Temp:  36.8 C  Resp: 14 16    Last Pain:  Filed Vitals:   06/06/15 0308  PainSc: 3                  Kennieth RadFitzgerald, Roux Brandy E

## 2015-06-08 ENCOUNTER — Encounter (HOSPITAL_COMMUNITY): Payer: Self-pay | Admitting: Emergency Medicine

## 2015-06-08 ENCOUNTER — Emergency Department (HOSPITAL_COMMUNITY)
Admission: EM | Admit: 2015-06-08 | Discharge: 2015-06-08 | Disposition: A | Discharge status: Home / Self Care | Payer: BLUE CROSS/BLUE SHIELD | Attending: Emergency Medicine | Admitting: Emergency Medicine

## 2015-06-08 ENCOUNTER — Emergency Department (HOSPITAL_COMMUNITY): Payer: BLUE CROSS/BLUE SHIELD

## 2015-06-08 DIAGNOSIS — Z79899 Other long term (current) drug therapy: Secondary | ICD-10-CM | POA: Diagnosis not present

## 2015-06-08 DIAGNOSIS — N23 Unspecified renal colic: Secondary | ICD-10-CM

## 2015-06-08 DIAGNOSIS — Z8639 Personal history of other endocrine, nutritional and metabolic disease: Secondary | ICD-10-CM | POA: Diagnosis not present

## 2015-06-08 DIAGNOSIS — Z793 Long term (current) use of hormonal contraceptives: Secondary | ICD-10-CM | POA: Insufficient documentation

## 2015-06-08 DIAGNOSIS — N1330 Unspecified hydronephrosis: Secondary | ICD-10-CM

## 2015-06-08 DIAGNOSIS — R109 Unspecified abdominal pain: Secondary | ICD-10-CM | POA: Diagnosis present

## 2015-06-08 LAB — URINALYSIS, ROUTINE W REFLEX MICROSCOPIC
Bilirubin Urine: NEGATIVE
Glucose, UA: NEGATIVE mg/dL
Ketones, ur: NEGATIVE mg/dL
Nitrite: NEGATIVE
Protein, ur: 100 mg/dL — AB
Specific Gravity, Urine: 1.022 (ref 1.005–1.030)
pH: 8 (ref 5.0–8.0)

## 2015-06-08 LAB — URINE MICROSCOPIC-ADD ON

## 2015-06-08 MED ORDER — ONDANSETRON 8 MG PO TBDP
8.0000 mg | ORAL_TABLET | Freq: Once | ORAL | Status: AC
  Administered 2015-06-08: 8 mg via ORAL
  Filled 2015-06-08: qty 1

## 2015-06-08 MED ORDER — HYDROMORPHONE HCL 1 MG/ML IJ SOLN
1.0000 mg | Freq: Once | INTRAMUSCULAR | Status: AC
  Administered 2015-06-08: 1 mg via INTRAMUSCULAR
  Filled 2015-06-08: qty 1

## 2015-06-08 MED ORDER — OXYCODONE-ACETAMINOPHEN 5-325 MG PO TABS: 1.0000 | ORAL_TABLET | ORAL | Status: DC | PRN

## 2015-06-08 NOTE — ED Notes (Signed)
Pt reports R flank pain today.  States having a lithotripsy Friday.  Removed her stent out this am and started to have R flank pain 2 hours after.  Pt also reports nausea.

## 2015-06-08 NOTE — ED Notes (Signed)
Pt reports kidney stone removal Friday; stent removed last night; onset right flank pain post removal. Pt reports hematuria but denies dysuria.

## 2015-06-08 NOTE — Discharge Instructions (Signed)
Get plenty of rest and drink a lot of fluids. Your pain should gradually subside. Call your urologist as needed for problems.    Hydronephrosis Hydronephrosis is the enlargement of a kidney due to a blockage that stops urine from flowing out of the body. CAUSES Common causes of this condition include:  A birth (congenital) defect of the kidney.  A congenital defect of the tube through which urine travels (ureter).  Kidney stones.  An enlarged prostate gland.  A tumor.  Cancer of the prostate, bladder, uterus, ovary, or colon.  A blood clot. SYMPTOMS Symptoms of this condition include:  Pain or discomfort in your side (flank).  Swelling of the abdomen.  Pain in the abdomen.  Nausea and vomiting.  Fever.  Pain while passing urine.  Feeling of urgency to urinate.  Frequent urination.  Infection of the urinary tract. In some cases, there are no symptoms. DIAGNOSIS This condition may be diagnosed with:  A medical history.  A physical exam.  Blood and urine tests to check kidney function.  Imaging tests, such as an X-ray, ultrasound, CT scan, or MRI.  A test in which a rigid or flexible telescope (cystoscope) is used to view the site of the blockage. TREATMENT Treatment for this condition depends on where the blockage is located, how long it has been there, and what caused it. The goal of treatment is to remove the blockage. Treatment options include:  A procedure to put in a soft tube to help drain urine.  Antibiotic medicines to treat or prevent infection.  Shock-wave therapy (lithotripsy) to help eliminate kidney stones. HOME CARE INSTRUCTIONS  Get lots of rest.  Drink enough fluid to keep your urine clear or pale yellow.  If you have a drain in, follow your health care provider's instructions about how to care for it.  Take medicines only as directed by your health care provider.  If you were prescribed an antibiotic medicine, finish all of it  even if you start to feel better.  Keep all follow-up visits as directed by your health care provider. This is important. SEEK MEDICAL CARE IF:  You continue to have symptoms after treatment.  You develop new symptoms.  You have a problem with a drainage device.  Your urine becomes cloudy or bloody.  You have a fever. SEEK IMMEDIATE MEDICAL CARE IF:  You have severe flank or abdominal pain.  You develop vomiting and are unable to keep fluids down.   This information is not intended to replace advice given to you by your health care provider. Make sure you discuss any questions you have with your health care provider.   Document Released: 03/06/2007 Document Revised: 09/23/2014 Document Reviewed: 05/05/2014 Elsevier Interactive Patient Education 2016 [employer]  Renal Colic Renal colic is pain that is caused by passing a kidney stone. The pain can be sharp and severe. It may be felt in the back, abdomen, side (flank), or groin. It can cause nausea. Renal colic can come and go. HOME CARE INSTRUCTIONS Watch your condition for any changes. The following actions may help to lessen any discomfort that you are feeling:  Take medicines only as directed by your health care provider.  Ask your health care provider if it is okay to take over-the-counter pain medicine.  Drink enough fluid to keep your urine clear or pale yellow. Drink 6-8 glasses of water each day.  Limit the amount of salt that you eat to less than 2 grams per day.  Reduce  the amount of protein in your diet. Eat less meat, fish, nuts, and dairy.  Avoid foods such as spinach, rhubarb, nuts, or bran. These may make kidney stones more likely to form. SEEK MEDICAL CARE IF:  You have a fever or chills.  Your urine smells bad or looks cloudy.  You have pain or burning when you pass urine. SEEK IMMEDIATE MEDICAL CARE IF:  Your flank pain or groin pain suddenly worsens.  You become confused or disoriented or you  lose consciousness.   This information is not intended to replace advice given to you by your health care provider. Make sure you discuss any questions you have with your health care provider.   Document Released: 02/16/2005 Document Revised: 05/30/2014 Document Reviewed: 03/19/2014 Elsevier Interactive Patient Education [employer] Else[employer] Inc.

## 2015-06-08 NOTE — ED Provider Notes (Signed)
CSN: [id number]     Arrival date & time 06/08/15  4098 History   First MD Initiated Contact with Patient 06/08/15 [phone]     Chief Complaint  Patient presents with  . Post-op Problem  . Flank Pain     (Consider location/radiation/quality/duration/timing/severity/associated sxs/prior Treatment) HPI   Samantha Larson is a 25 y.o. female who presents for evaluation of right flank pain which has worsened over the last 24 hours. She had ureteroscopy with laser removal of a ureteral stone, 3 days ago. She felt like her ureteral stent was bothering her so took it out around midnight last night. She denies nausea, vomiting, fever, chills. She took ibuprofen without relief of the pain. She was prescribed tramadol, but did not use it last night because it had not previously worked for her pain. There are no other no modifying factors.   Past Medical History  Diagnosis Date  . Polycystic ovarian syndrome    Past Surgical History  Procedure Laterality Date  . Brain surgery    . Wisdom tooth extraction     Family History  Problem Relation Age of Onset  . Kidney Stones Father    Social History  Substance Use Topics  . Smoking status: Never Smoker   . Smokeless tobacco: Never Used  . Alcohol Use: Yes     Comment: rare   OB History    No data available     Review of Systems  All other systems reviewed and are negative.     Allergies  Review of patient's allergies indicates no known allergies.  Home Medications   Prior to Admission medications   Medication Sig Start Date End Date Taking? Authorizing Provider  ibuprofen (ADVIL,MOTRIN) 200 MG tablet Take 400 mg by mouth every 6 (six) hours as needed for moderate pain.   Yes Historical Provider, MD  norgestrel-ethinyl estradiol (CRYSELLE-28) 0.3-30 MG-MCG tablet Take 1 tablet by mouth daily.   Yes Historical Provider, MD  phenazopyridine (PYRIDIUM) 200 MG tablet Take 1 tablet (200 mg total) by mouth 3 (three) times daily as needed  for pain. 06/06/15  Yes Crist Fat, MD  traMADol (ULTRAM) 50 MG tablet Take 1-2 tablets (50-100 mg total) by mouth every 6 (six) hours as needed for moderate pain. 06/06/15  Yes Crist Fat, MD  Trospium Chloride 60 MG CP24 Take 1 capsule (60 mg total) by mouth daily. 06/06/15  Yes Crist Fat, MD  ciprofloxacin (CIPRO) 500 MG tablet Take 1 tablet (500 mg total) by mouth once. Patient not taking: Reported on 06/08/2015 06/06/15   Crist Fat, MD  nitrofurantoin, macrocrystal-monohydrate, (MACROBID) 100 MG capsule Take 100 mg by mouth 2 (two) times daily. Reported on 06/08/2015 06/03/15   Historical Provider, MD  oxyCODONE-acetaminophen (PERCOCET) 5-325 MG tablet Take 1 tablet by mouth every 4 (four) hours as needed for severe pain. 06/08/15   Mancel Bale, MD  phenazopyridine (PYRIDIUM) 100 MG tablet Take 100 mg by mouth 3 (three) times daily as needed for pain. Reported on 06/08/2015    Historical Provider, MD   BP 107/73 mmHg  Pulse 80  Temp(Src) 97.8 F (36.6 C) (Oral)  Resp 16  SpO2 100%  LMP 05/11/2015 Physical Exam  Constitutional: She is oriented to person, place, and time. She appears well-developed and well-nourished.  HENT:  Head: Normocephalic and atraumatic.  Right Ear: External ear normal.  Left Ear: External ear normal.  Eyes: Conjunctivae and EOM are normal. Pupils are equal, round, and reactive to light.  Neck: Normal range of motion and phonation normal. Neck supple.  Cardiovascular: Normal rate, regular rhythm and normal heart sounds.   Pulmonary/Chest: Effort normal and breath sounds normal. She exhibits no bony tenderness.  Abdominal: Soft. There is no tenderness.  Genitourinary:  Mild right flank tenderness with percussion.  Musculoskeletal: Normal range of motion.  Neurological: She is alert and oriented to person, place, and time. No cranial nerve deficit or sensory deficit. She exhibits normal muscle tone. Coordination normal.  Skin: Skin is  warm, dry and intact.  Psychiatric: She has a normal mood and affect. Her behavior is normal. Judgment and thought content normal.  Nursing note and vitals reviewed.   ED Course  Procedures (including critical care time)  Medications  HYDROmorphone (DILAUDID) injection 1 mg (1 mg Intramuscular Given 06/08/15 0828)  ondansetron (ZOFRAN-ODT) disintegrating tablet 8 mg (8 mg Oral Given 06/08/15 0827)  HYDROmorphone (DILAUDID) injection 1 mg (1 mg Intramuscular Given 06/08/15 0921)    Patient Vitals for the past 24 hrs:  BP Temp Temp src Pulse Resp SpO2  06/08/15 1143 107/73 mmHg - - 80 16 100 %  06/08/15 0756 97/63 mmHg 97.8 F (36.6 C) Oral 88 14 99 %    11:40 AM Reevaluation with update and discussion. After initial assessment and treatment, an updated evaluation reveals pain is controlled.Mancel Bale L   11:45- case discussed with urology, Dr. Annabell Howells, who states that the patient can be safely discharged.  Labs Review Labs Reviewed  URINALYSIS, ROUTINE W REFLEX MICROSCOPIC (NOT AT [facility]) - Abnormal; Notable for the following:    Color, Urine AMBER (*)    APPearance TURBID (*)    Hgb urine dipstick LARGE (*)    Protein, ur 100 (*)    Leukocytes, UA TRACE (*)    All other components within normal limits  URINE MICROSCOPIC-ADD ON - Abnormal; Notable for the following:    Squamous Epithelial / LPF 6-30 (*)    Bacteria, UA MANY (*)    Crystals TRIPLE PHOSPHATE CRYSTALS (*)    All other components within normal limits  URINE CULTURE    Imaging Review US Renal  06/08/2015  CLINICAL DATA:  Right flank pain.  Recent laser ureteroscopy EXAM: RENAL / URINARY TRACT ULTRASOUND COMPLETE COMPARISON:  Abdominal CT 06/04/2014 FINDINGS: Right Kidney: Length: 11.5 cm. Mild hydronephrosis and upper hydroureter, similar to comparison. No obstructive process is visualized; patient had obstructing ureteral calculus on previous imaging. Left Kidney: Length: 11.5 cm. Echogenicity within normal  limits. No mass or hydronephrosis visualized. Bladder: Not evaluated due to collapsed appearance. Ureteral jets were not evaluated. IMPRESSION: Mild right hydronephrosis, similar to 06/05/2015 CT. Electronically Signed   By: Marnee Spring M.D.   On: 06/08/2015 09:19   I have personally reviewed and evaluated these images and lab results as part of my medical decision-making.   EKG Interpretation None      MDM   Final diagnoses:  Renal colic  Hydronephrosis, unspecified hydronephrosis type    Renal colic with hematuria, and mild hydronephrosis, unlikely to represent retained stone products. Doubt UTI, sepsis, or metabolic instability.  Nursing Notes Reviewed/ Care Coordinated Applicable Imaging Reviewed Interpretation of Laboratory Data incorporated into ED treatment  The patient appears reasonably screened and/or stabilized for discharge and I doubt any other medical condition or other [facility] requiring further screening, evaluation, or treatment in the ED at this time prior to discharge.  Plan: Home Medications- Percocet; Home Treatments- rest; return here if the recommended treatment, does not improve  the symptoms; Recommended follow up- Urology 1 week and prn     Mancel BaleElliott Juanya Villavicencio, MD 06/08/15 1153

## 2015-06-09 LAB — URINE CULTURE: Culture: >=100000

## 2015-06-10 ENCOUNTER — Telehealth (HOSPITAL_COMMUNITY): Payer: Self-pay

## 2015-06-10 NOTE — Telephone Encounter (Signed)
Post ED Visit - Positive Culture Follow-up  Culture report reviewed by antimicrobial stewardship pharmacist:   Enzo Bi, Pharm.D.  Celedonio Miyamoto, Pharm.D., BCPS  Garvin Fila, Pharm.D.  Georgina Pillion, Pharm.D., BCPS  [place], [address].D., BCPS, AAHIVP  Estella Husk, Pharm.D., BCPS, AAHIVP  Tennis Must, Pharm.D.  Sherle Poe, [address]DJacinto Reap, Pharm.D.  Positive urine culture, >/= 100,000 colonies -> Lactobacillus Species Chart reviewed by Laveda Norman PA "No treatment, f/u UTI w/urology"   Arvid Right 06/10/2015, 11:19 AM

## 2015-11-05 DIAGNOSIS — M7061 Trochanteric bursitis, right hip: Secondary | ICD-10-CM | POA: Diagnosis not present

## 2015-12-06 DIAGNOSIS — H1013 Acute atopic conjunctivitis, bilateral: Secondary | ICD-10-CM | POA: Diagnosis not present

## 2016-01-19 DIAGNOSIS — F419 Anxiety disorder, unspecified: Secondary | ICD-10-CM | POA: Diagnosis not present

## 2016-01-19 DIAGNOSIS — R109 Unspecified abdominal pain: Secondary | ICD-10-CM | POA: Diagnosis not present

## 2016-10-06 DIAGNOSIS — Z9889 Other specified postprocedural states: Secondary | ICD-10-CM | POA: Diagnosis not present

## 2016-10-06 DIAGNOSIS — E282 Polycystic ovarian syndrome: Secondary | ICD-10-CM | POA: Diagnosis not present

## 2016-10-06 DIAGNOSIS — F419 Anxiety disorder, unspecified: Secondary | ICD-10-CM | POA: Diagnosis not present

## 2016-10-06 DIAGNOSIS — R51 Headache: Secondary | ICD-10-CM | POA: Diagnosis not present

## 2016-11-01 ENCOUNTER — Encounter (INDEPENDENT_AMBULATORY_CARE_PROVIDER_SITE_OTHER): Payer: Self-pay

## 2016-11-01 ENCOUNTER — Encounter: Payer: Self-pay | Admitting: Neurology

## 2016-11-01 ENCOUNTER — Ambulatory Visit (INDEPENDENT_AMBULATORY_CARE_PROVIDER_SITE_OTHER): Payer: BLUE CROSS/BLUE SHIELD | Admitting: Neurology

## 2016-11-01 DIAGNOSIS — G43709 Chronic migraine without aura, not intractable, without status migrainosus: Secondary | ICD-10-CM | POA: Diagnosis not present

## 2016-11-01 DIAGNOSIS — R413 Other amnesia: Secondary | ICD-10-CM | POA: Diagnosis not present

## 2016-11-01 DIAGNOSIS — IMO0002 Reserved for concepts with insufficient information to code with codable children: Secondary | ICD-10-CM | POA: Insufficient documentation

## 2016-11-01 DIAGNOSIS — E538 Deficiency of other specified B group vitamins: Secondary | ICD-10-CM

## 2016-11-01 DIAGNOSIS — Z9889 Other specified postprocedural states: Secondary | ICD-10-CM | POA: Diagnosis not present

## 2016-11-01 MED ORDER — ONDANSETRON 4 MG PO TBDP: 4.0000 mg | ORAL_TABLET | Freq: Three times a day (TID) | ORAL | 11 refills | Status: DC | PRN

## 2016-11-01 MED ORDER — SUMATRIPTAN SUCCINATE 50 MG PO TABS: 50.0000 mg | ORAL_TABLET | ORAL | 11 refills | Status: DC | PRN

## 2016-11-01 MED ORDER — ZONISAMIDE 100 MG PO CAPS: 100.0000 mg | ORAL_CAPSULE | Freq: Two times a day (BID) | ORAL | 11 refills | Status: DC
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
## 2016-11-02 ENCOUNTER — Telehealth: Payer: Self-pay | Admitting: Neurology

## 2016-11-02 DIAGNOSIS — E538 Deficiency of other specified B group vitamins: Secondary | ICD-10-CM

## 2016-11-02 LAB — COMPREHENSIVE METABOLIC PANEL
ALT: 9 IU/L (ref 0–32)
AST: 17 IU/L (ref 0–40)
Albumin/Globulin Ratio: 1.6 (ref 1.2–2.2)
Albumin: 4.4 g/dL (ref 3.5–5.5)
Alkaline Phosphatase: 60 IU/L (ref 39–117)
BUN/Creatinine Ratio: 17 (ref 9–23)
BUN: 10 mg/dL (ref 6–20)
Bilirubin Total: <0.2 mg/dL (ref 0.0–1.2)
CO2: 24 mmol/L (ref 20–29)
Calcium: 9.8 mg/dL (ref 8.7–10.2)
Chloride: 102 mmol/L (ref 96–106)
Creatinine, Ser: 0.6 mg/dL (ref 0.57–1.00)
GFR calc Af Amer: 144 mL/min/{1.73_m2} (ref >59)
GFR calc non Af Amer: 125 mL/min/{1.73_m2} (ref >59)
Globulin, Total: 2.7 g/dL (ref 1.5–4.5)
Glucose: 85 mg/dL (ref 65–99)
Potassium: 5.2 mmol/L (ref 3.5–5.2)
Sodium: 140 mmol/L (ref 134–144)
Total Protein: 7.1 g/dL (ref 6.0–8.5)

## 2016-11-02 LAB — CBC WITH DIFFERENTIAL/PLATELET
Basophils Absolute: 0 10*3/uL (ref 0.0–0.2)
Basos: 0 %
EOS (ABSOLUTE): 0.1 10*3/uL (ref 0.0–0.4)
Eos: 3 %
Hematocrit: 42.5 % (ref 34.0–46.6)
Hemoglobin: 14.1 g/dL (ref 11.1–15.9)
Immature Grans (Abs): 0 10*3/uL (ref 0.0–0.1)
Immature Granulocytes: 0 %
Lymphocytes Absolute: 1.8 10*3/uL (ref 0.7–3.1)
Lymphs: 32 %
MCH: 28.4 pg (ref 26.6–33.0)
MCHC: 33.2 g/dL (ref 31.5–35.7)
MCV: 86 fL (ref 79–97)
Monocytes Absolute: 0.3 10*3/uL (ref 0.1–0.9)
Monocytes: 5 %
Neutrophils Absolute: 3.2 10*3/uL (ref 1.4–7.0)
Neutrophils: 60 %
Platelets: 332 10*3/uL (ref 150–379)
RBC: 4.96 x10E6/uL (ref 3.77–5.28)
RDW: 13.8 % (ref 12.3–15.4)
WBC: 5.5 10*3/uL (ref 3.4–10.8)

## 2016-11-02 LAB — TSH: TSH: 2.65 u[IU]/mL (ref 0.450–4.500)

## 2016-11-02 LAB — VITAMIN B12: Vitamin B-12: 155 pg/mL — ABNORMAL LOW (ref 232–1245)

## 2016-11-02 LAB — RPR: RPR Ser Ql: NONREACTIVE

## 2016-11-02 NOTE — Telephone Encounter (Signed)
Patient called office returning RN's call.  Please call °

## 2016-11-02 NOTE — Telephone Encounter (Signed)
Called pt back w/ lab results and recommendations for B12 supplementation. She plans on having labs drawn when she returns to office for MRI next week.   Voices concern about side effect of kidney stones w/ zonisamide. Says that she'd rather deal w/ a headache or just use rescue med as needed for migraine rather than risk another kidney stone.  Also does not believe that she'll be able to afford both an MRI and an EEG. Would like Dr. Zannie CoveYan's advice on which test would be most important to have done if she would need to put one of them off until next year.

## 2016-11-02 NOTE — Telephone Encounter (Signed)
Please call patient, her laboratory evaluation showed low vitamin B12 155, she needs to come back to our office for repeat vitamin B 12 level, homocystine, folic acid, methylmalonic acid level,  After repeat lab, we can started vitamin B12 IM supplement, 1000 g daily, for one week, then weekly for one month, then monthly,

## 2016-11-02 NOTE — Telephone Encounter (Signed)
Called pt but was unable to reach her. Asked that she call back for lab results and recommendations.

## 2016-11-07 MED ORDER — LAMOTRIGINE 25 MG PO TABS: ORAL_TABLET | ORAL | 0 refills | Status: DC

## 2016-11-07 MED ORDER — LAMOTRIGINE 100 MG PO TABS: 100.0000 mg | ORAL_TABLET | Freq: Two times a day (BID) | ORAL | 11 refills | Status: DC

## 2016-11-07 NOTE — Addendum Note (Signed)
Addended by: Lindell SparKIRKMAN, Notnamed Scholz C on: 11/07/2016 06:00 PM   Modules accepted: Orders

## 2016-11-07 NOTE — Telephone Encounter (Signed)
MRI brain would give us a lot of information.  Other options for migraine prevention would be lamotrigine, also good for her mood.

## 2016-11-07 NOTE — Telephone Encounter (Signed)
Spoke to patient - she is agreeable to MRI and lamotrigine.  Dr. Terrace ArabiaYan has provided prescriptions for her titrating doses.  Also, she is willing to have B12 injections but would like these at her PCP office (Dr. Caryn BeeKevin Via) due to the convenience of his office.  Her lab results and and recommended B12 injection schedule has been faxed to his office.  The patient is aware to expect a call from them for scheduling.

## 2016-11-08 NOTE — Telephone Encounter (Signed)
She is scheduled to have her MRI tomorrow Wednesday 11/09/16 at our [facility] mobile unit.

## 2016-11-09 ENCOUNTER — Telehealth: Payer: Self-pay | Admitting: Neurology

## 2016-11-09 ENCOUNTER — Ambulatory Visit (INDEPENDENT_AMBULATORY_CARE_PROVIDER_SITE_OTHER): Payer: BLUE CROSS/BLUE SHIELD

## 2016-11-09 DIAGNOSIS — G43709 Chronic migraine without aura, not intractable, without status migrainosus: Secondary | ICD-10-CM | POA: Diagnosis not present

## 2016-11-09 DIAGNOSIS — R413 Other amnesia: Secondary | ICD-10-CM | POA: Diagnosis not present

## 2016-11-09 DIAGNOSIS — Z9889 Other specified postprocedural states: Secondary | ICD-10-CM | POA: Diagnosis not present

## 2016-11-09 DIAGNOSIS — E538 Deficiency of other specified B group vitamins: Secondary | ICD-10-CM | POA: Diagnosis not present

## 2016-11-09 DIAGNOSIS — IMO0002 Reserved for concepts with insufficient information to code with codable children: Secondary | ICD-10-CM

## 2016-11-09 MED ORDER — GADOPENTETATE DIMEGLUMINE 469.01 MG/ML IV SOLN: 15.0000 mL | Freq: Once | INTRAVENOUS | Status: AC | PRN

## 2016-11-09 NOTE — Telephone Encounter (Signed)
  I called patient. MRI the brain shows chronic sequela of the prior traumatic brain injury with injury to the right temporoparietal area. No acute changes were seen.  MRI brain 11/09/16:  IMPRESSION:  This MRI of the brain with and without contrast shows the following: 1.     Sequela of right temporoparietal intraparenchymal hemorrhage that was acute on the 04/07/2011 CT scan. There is encephalomalacia, chronic heme products and altered signal in the adjacent brain.     2.     Sequela of prior left anterior temporal craniotomy. Underlying brain has normal signal. 3.     There are no acute findings and there is a normal enhancement pattern.

## 2016-11-09 NOTE — Addendum Note (Signed)
Addended by: Margo AyeLOGAN, Shaquita Fort L on: 11/09/2016 08:44 AM   Modules accepted: Orders

## 2016-11-11 LAB — METHYLMALONIC ACID, SERUM: Methylmalonic Acid: 150 nmol/L (ref 0–378)

## 2016-11-11 LAB — HOMOCYSTEINE: Homocysteine: 8.1 umol/L (ref 0.0–15.0)

## 2016-11-11 LAB — FOLATE: Folate: 6 ng/mL (ref >3.0)

## 2016-11-11 LAB — VITAMIN B12: Vitamin B-12: 187 pg/mL — ABNORMAL LOW (ref 232–1245)

## 2016-11-11 LAB — COPPER, SERUM: Copper: 167 ug/dL — ABNORMAL HIGH (ref 72–166)

## 2016-11-11 NOTE — Progress Notes (Signed)
B12 low still, slightly better compared to 10 days, prior, not sure, why it was rechecked so soon? Continue B12 replacement through PCP. Other labs okay, Co[place] level upper limit of normal, can be monitored down the road.  Please call pt.  Labs reviewed for Dr. Terrace ArabiaYan. Huston FoleySaima Evie Croston, MD, PhD [facility])

## 2016-11-14 DIAGNOSIS — E538 Deficiency of other specified B group vitamins: Secondary | ICD-10-CM | POA: Diagnosis not present

## 2016-11-15 DIAGNOSIS — E538 Deficiency of other specified B group vitamins: Secondary | ICD-10-CM | POA: Diagnosis not present

## 2016-11-16 DIAGNOSIS — E538 Deficiency of other specified B group vitamins: Secondary | ICD-10-CM | POA: Diagnosis not present

## 2016-11-17 DIAGNOSIS — E538 Deficiency of other specified B group vitamins: Secondary | ICD-10-CM | POA: Diagnosis not present

## 2016-11-22 DIAGNOSIS — E538 Deficiency of other specified B group vitamins: Secondary | ICD-10-CM | POA: Diagnosis not present

## 2016-11-24 DIAGNOSIS — E538 Deficiency of other specified B group vitamins: Secondary | ICD-10-CM | POA: Diagnosis not present

## 2016-11-24 DIAGNOSIS — F419 Anxiety disorder, unspecified: Secondary | ICD-10-CM | POA: Diagnosis not present

## 2016-11-24 NOTE — Telephone Encounter (Signed)
Pt calling back asking to be called again to discuss the MRI results please

## 2016-11-27 NOTE — Telephone Encounter (Signed)
I called the patient again, left a message. The MRI the brain results show the expected chronic changes following the brain injury in 2012. Nothing seen was unexpected.

## 2016-11-30 DIAGNOSIS — E538 Deficiency of other specified B group vitamins: Secondary | ICD-10-CM | POA: Diagnosis not present

## 2016-11-30 DIAGNOSIS — M7061 Trochanteric bursitis, right hip: Secondary | ICD-10-CM | POA: Diagnosis not present

## 2016-11-30 DIAGNOSIS — M7631 Iliotibial band syndrome, right leg: Secondary | ICD-10-CM | POA: Diagnosis not present

## 2016-12-01 ENCOUNTER — Other Ambulatory Visit: Payer: BLUE CROSS/BLUE SHIELD

## 2016-12-09 DIAGNOSIS — E538 Deficiency of other specified B group vitamins: Secondary | ICD-10-CM | POA: Diagnosis not present

## 2016-12-16 DIAGNOSIS — E538 Deficiency of other specified B group vitamins: Secondary | ICD-10-CM | POA: Diagnosis not present

## 2016-12-22 DIAGNOSIS — L700 Acne vulgaris: Secondary | ICD-10-CM | POA: Diagnosis not present

## 2016-12-22 DIAGNOSIS — B070 Plantar wart: Secondary | ICD-10-CM | POA: Diagnosis not present

## 2017-01-10 DIAGNOSIS — M6250 Muscle wasting and atrophy, not elsewhere classified, unspecified site: Secondary | ICD-10-CM | POA: Diagnosis not present

## 2017-01-10 DIAGNOSIS — M62552 Muscle wasting and atrophy, not elsewhere classified, left thigh: Secondary | ICD-10-CM | POA: Diagnosis not present

## 2017-01-10 DIAGNOSIS — M62551 Muscle wasting and atrophy, not elsewhere classified, right thigh: Secondary | ICD-10-CM | POA: Diagnosis not present

## 2017-01-10 DIAGNOSIS — M25551 Pain in right hip: Secondary | ICD-10-CM | POA: Diagnosis not present

## 2017-01-17 DIAGNOSIS — M62552 Muscle wasting and atrophy, not elsewhere classified, left thigh: Secondary | ICD-10-CM | POA: Diagnosis not present

## 2017-01-17 DIAGNOSIS — M6250 Muscle wasting and atrophy, not elsewhere classified, unspecified site: Secondary | ICD-10-CM | POA: Diagnosis not present

## 2017-01-17 DIAGNOSIS — M62551 Muscle wasting and atrophy, not elsewhere classified, right thigh: Secondary | ICD-10-CM | POA: Diagnosis not present

## 2017-01-17 DIAGNOSIS — M25551 Pain in right hip: Secondary | ICD-10-CM | POA: Diagnosis not present

## 2017-01-26 DIAGNOSIS — M25551 Pain in right hip: Secondary | ICD-10-CM | POA: Diagnosis not present

## 2017-01-26 DIAGNOSIS — M6250 Muscle wasting and atrophy, not elsewhere classified, unspecified site: Secondary | ICD-10-CM | POA: Diagnosis not present

## 2017-01-26 DIAGNOSIS — M62551 Muscle wasting and atrophy, not elsewhere classified, right thigh: Secondary | ICD-10-CM | POA: Diagnosis not present

## 2017-01-26 DIAGNOSIS — M62552 Muscle wasting and atrophy, not elsewhere classified, left thigh: Secondary | ICD-10-CM | POA: Diagnosis not present

## 2017-02-01 ENCOUNTER — Ambulatory Visit: Payer: BLUE CROSS/BLUE SHIELD | Admitting: Neurology

## 2017-02-02 DIAGNOSIS — M62552 Muscle wasting and atrophy, not elsewhere classified, left thigh: Secondary | ICD-10-CM | POA: Diagnosis not present

## 2017-02-02 DIAGNOSIS — M6250 Muscle wasting and atrophy, not elsewhere classified, unspecified site: Secondary | ICD-10-CM | POA: Diagnosis not present

## 2017-02-02 DIAGNOSIS — M62551 Muscle wasting and atrophy, not elsewhere classified, right thigh: Secondary | ICD-10-CM | POA: Diagnosis not present

## 2017-02-02 DIAGNOSIS — M25551 Pain in right hip: Secondary | ICD-10-CM | POA: Diagnosis not present

## 2017-02-09 DIAGNOSIS — M25551 Pain in right hip: Secondary | ICD-10-CM | POA: Diagnosis not present

## 2017-02-09 DIAGNOSIS — M6250 Muscle wasting and atrophy, not elsewhere classified, unspecified site: Secondary | ICD-10-CM | POA: Diagnosis not present

## 2017-02-09 DIAGNOSIS — M62552 Muscle wasting and atrophy, not elsewhere classified, left thigh: Secondary | ICD-10-CM | POA: Diagnosis not present

## 2017-02-09 DIAGNOSIS — M62551 Muscle wasting and atrophy, not elsewhere classified, right thigh: Secondary | ICD-10-CM | POA: Diagnosis not present

## 2017-02-16 DIAGNOSIS — M25551 Pain in right hip: Secondary | ICD-10-CM | POA: Diagnosis not present

## 2017-02-16 DIAGNOSIS — M62551 Muscle wasting and atrophy, not elsewhere classified, right thigh: Secondary | ICD-10-CM | POA: Diagnosis not present

## 2017-02-16 DIAGNOSIS — E538 Deficiency of other specified B group vitamins: Secondary | ICD-10-CM | POA: Diagnosis not present

## 2017-02-16 DIAGNOSIS — M62552 Muscle wasting and atrophy, not elsewhere classified, left thigh: Secondary | ICD-10-CM | POA: Diagnosis not present

## 2017-02-16 DIAGNOSIS — M6250 Muscle wasting and atrophy, not elsewhere classified, unspecified site: Secondary | ICD-10-CM | POA: Diagnosis not present

## 2017-02-23 DIAGNOSIS — M6250 Muscle wasting and atrophy, not elsewhere classified, unspecified site: Secondary | ICD-10-CM | POA: Diagnosis not present

## 2017-02-23 DIAGNOSIS — M62551 Muscle wasting and atrophy, not elsewhere classified, right thigh: Secondary | ICD-10-CM | POA: Diagnosis not present

## 2017-02-23 DIAGNOSIS — M25551 Pain in right hip: Secondary | ICD-10-CM | POA: Diagnosis not present

## 2017-02-23 DIAGNOSIS — M62552 Muscle wasting and atrophy, not elsewhere classified, left thigh: Secondary | ICD-10-CM | POA: Diagnosis not present

## 2017-02-28 DIAGNOSIS — F419 Anxiety disorder, unspecified: Secondary | ICD-10-CM | POA: Diagnosis not present

## 2017-02-28 DIAGNOSIS — F900 Attention-deficit hyperactivity disorder, predominantly inattentive type: Secondary | ICD-10-CM | POA: Diagnosis not present

## 2017-03-16 DIAGNOSIS — M25551 Pain in right hip: Secondary | ICD-10-CM | POA: Diagnosis not present

## 2017-03-16 DIAGNOSIS — M62551 Muscle wasting and atrophy, not elsewhere classified, right thigh: Secondary | ICD-10-CM | POA: Diagnosis not present

## 2017-03-16 DIAGNOSIS — M6250 Muscle wasting and atrophy, not elsewhere classified, unspecified site: Secondary | ICD-10-CM | POA: Diagnosis not present

## 2017-03-16 DIAGNOSIS — M62552 Muscle wasting and atrophy, not elsewhere classified, left thigh: Secondary | ICD-10-CM | POA: Diagnosis not present

## 2017-03-21 DIAGNOSIS — N200 Calculus of kidney: Secondary | ICD-10-CM | POA: Diagnosis not present

## 2017-04-21 DIAGNOSIS — E538 Deficiency of other specified B group vitamins: Secondary | ICD-10-CM | POA: Diagnosis not present

## 2017-05-19 DIAGNOSIS — E538 Deficiency of other specified B group vitamins: Secondary | ICD-10-CM | POA: Diagnosis not present

## 2017-06-01 ENCOUNTER — Encounter (INDEPENDENT_AMBULATORY_CARE_PROVIDER_SITE_OTHER): Payer: Self-pay

## 2017-06-01 ENCOUNTER — Ambulatory Visit: Payer: BLUE CROSS/BLUE SHIELD | Admitting: Neurology

## 2017-06-01 ENCOUNTER — Encounter: Payer: Self-pay | Admitting: Neurology

## 2017-06-01 VITALS — BP 98/67 | HR 78 | Ht 66.0 in | Wt 145.5 lb

## 2017-06-01 DIAGNOSIS — G43709 Chronic migraine without aura, not intractable, without status migrainosus: Secondary | ICD-10-CM

## 2017-06-01 DIAGNOSIS — E538 Deficiency of other specified B group vitamins: Secondary | ICD-10-CM | POA: Insufficient documentation

## 2017-06-01 DIAGNOSIS — IMO0002 Reserved for concepts with insufficient information to code with codable children: Secondary | ICD-10-CM

## 2017-06-01 DIAGNOSIS — Z9889 Other specified postprocedural states: Secondary | ICD-10-CM | POA: Diagnosis not present

## 2017-06-01 MED ORDER — SUMATRIPTAN SUCCINATE 50 MG PO TABS: 50.0000 mg | ORAL_TABLET | ORAL | 11 refills | Status: DC | PRN
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
## 2017-06-02 ENCOUNTER — Telehealth: Payer: Self-pay | Admitting: *Deleted

## 2017-06-02 LAB — VITAMIN B12: Vitamin B-12: 446 pg/mL (ref 232–1245)

## 2017-06-02 NOTE — Telephone Encounter (Signed)
-----   Message from Levert FeinsteinYijun Yan, MD sent at 06/02/2017  9:23 AM EST ----- Please call patient for normal B12 446.

## 2017-06-02 NOTE — Telephone Encounter (Signed)
Spoke with Samantha Larson ParodyCaitlin this am and per YY, explained Vit. B12 level is normal.  She verbalized understanding of same/fim

## 2017-06-06 IMAGING — US US RENAL
1 series · 14 of 25 positions shown · non-contrast
Comparison: Abdominal CT 06/04/2014

CLINICAL DATA: Right flank pain.  Recent laser ureteroscopy

EXAM:
RENAL / URINARY TRACT ULTRASOUND COMPLETE

[Series 1: us renal · 0.23mm/px · 14 of 55 slices shown]
[im 1/55]
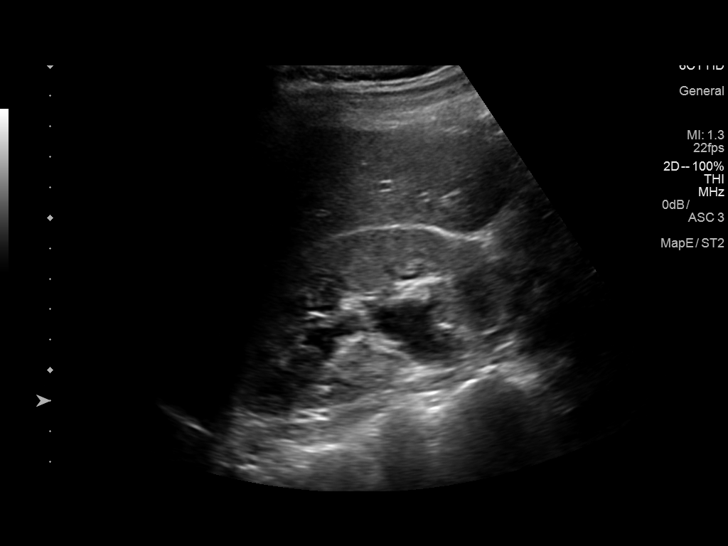
[im 5/55]
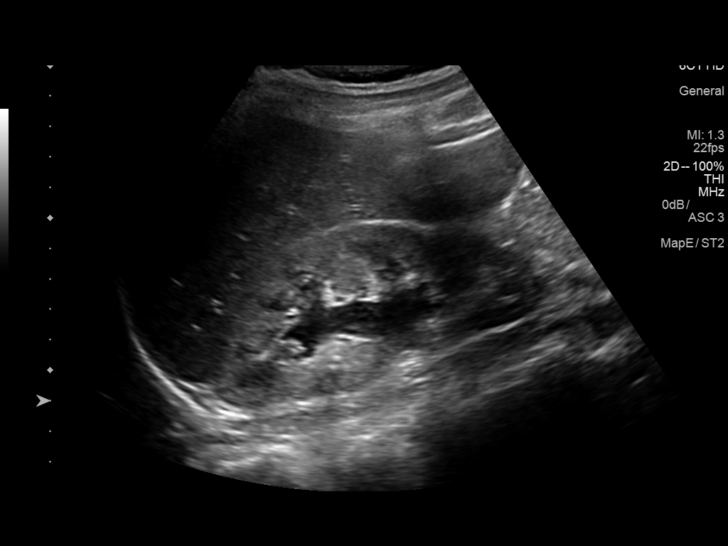
[im 10/55]
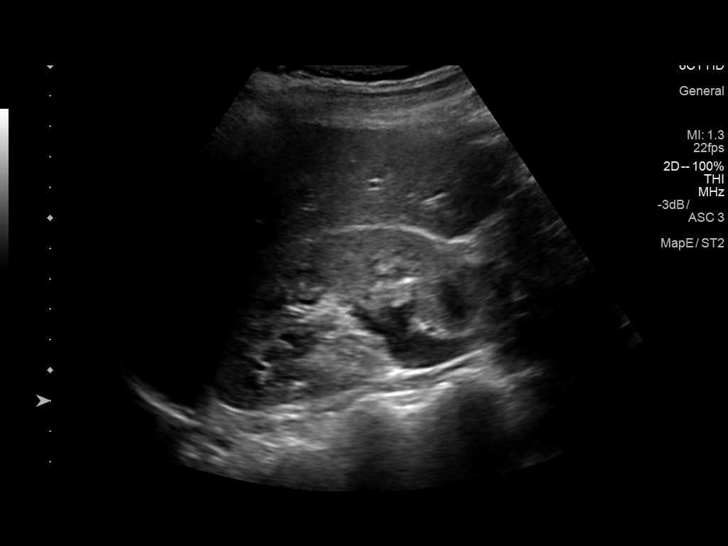
[im 14/55]
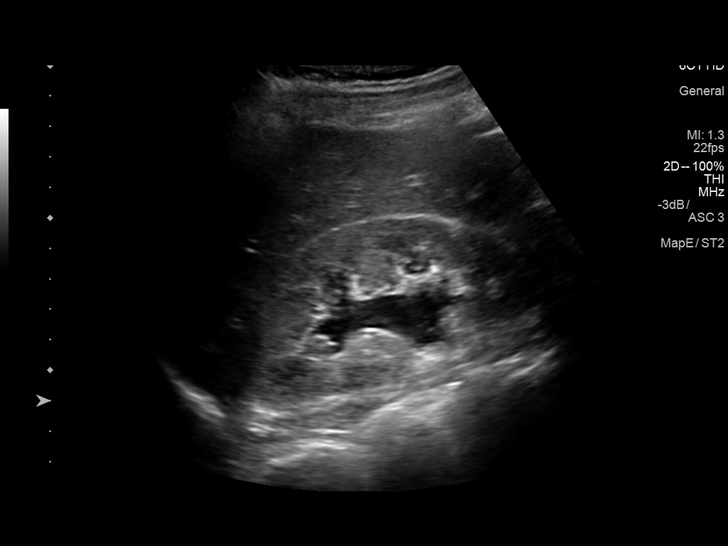
[im 19/55]
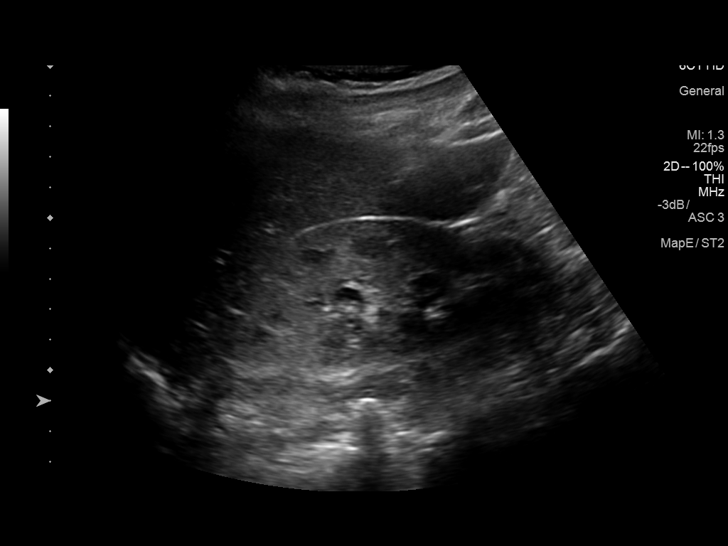
[im 21/55]
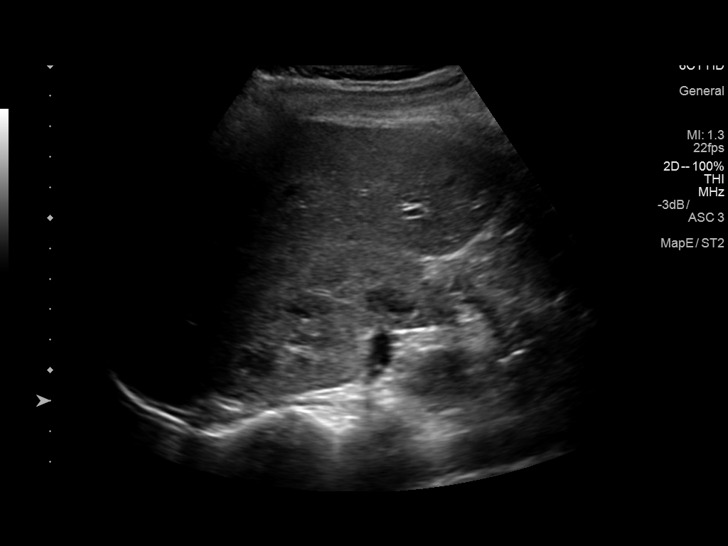
[im 25/55]
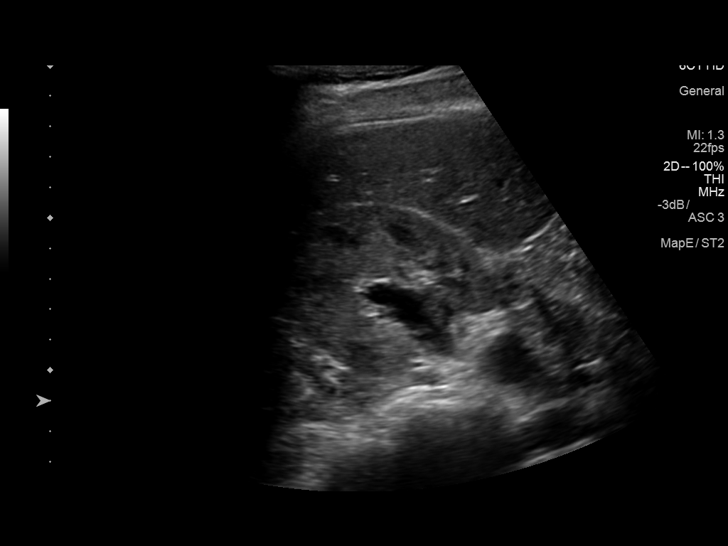
[im 30/55]
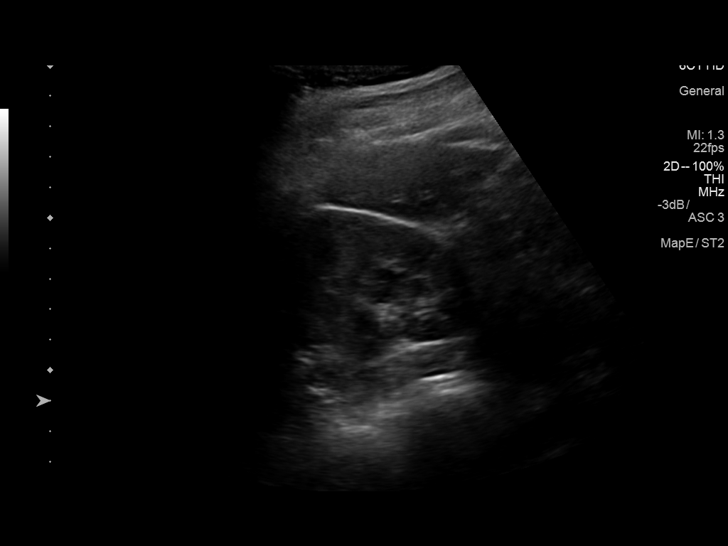
[im 34/55]
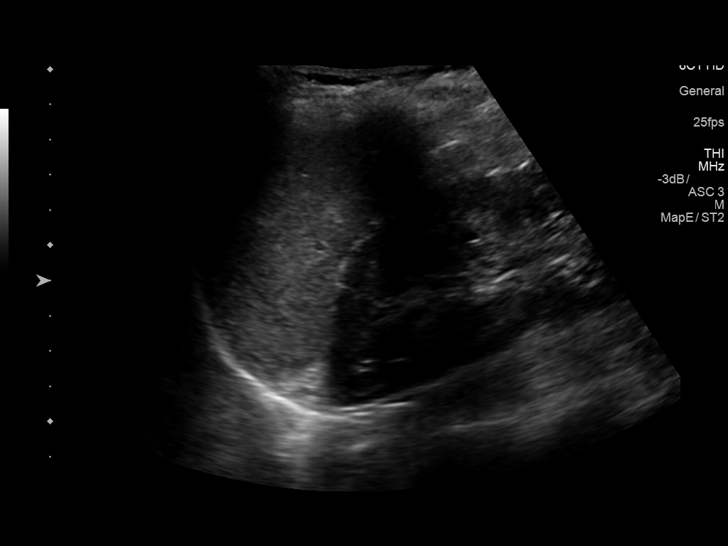
[im 37/55]
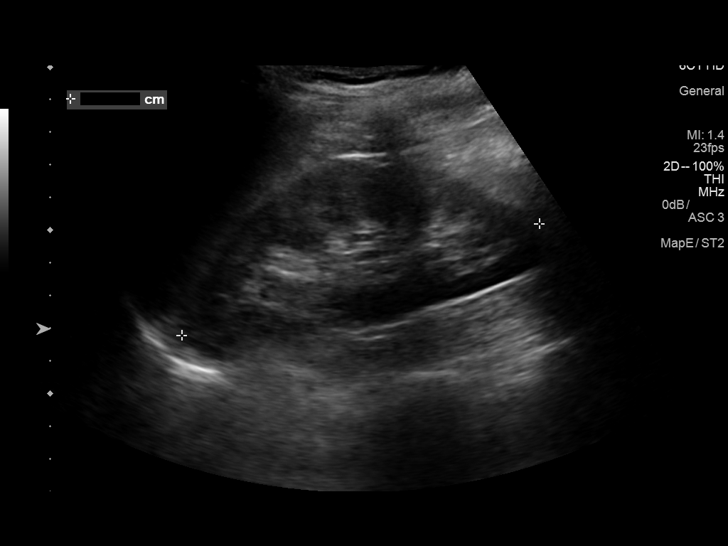
[im 41/55]
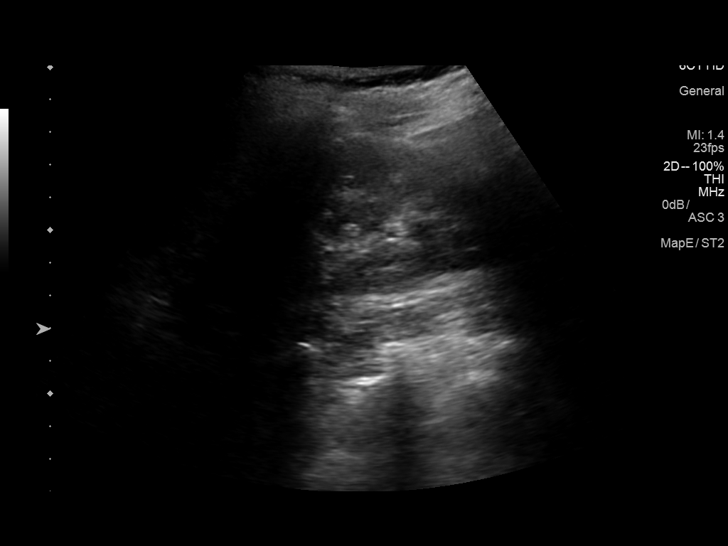
[im 46/55]
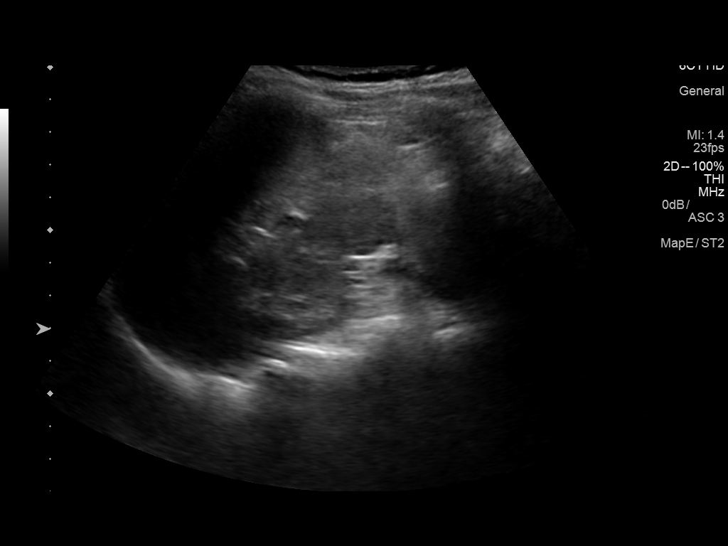
[im 50/55]
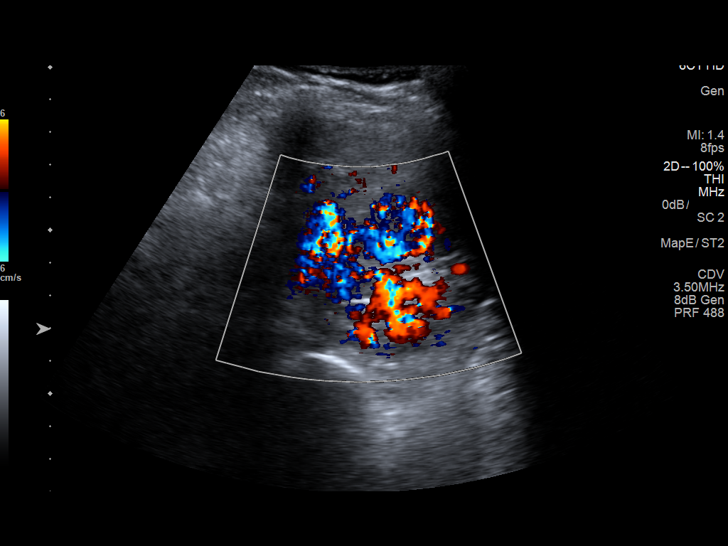
[im 55/55]
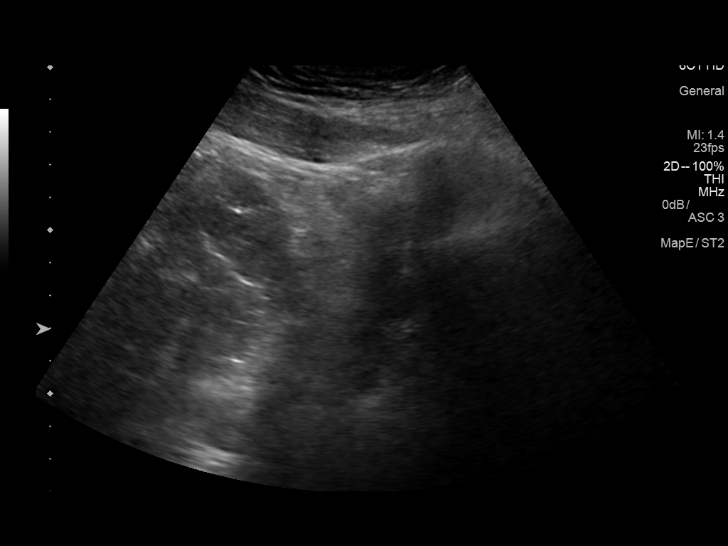

[14 of 25 positions shown; findings below may reference images not displayed]

FINDINGS: Right Kidney:

Length: 11.5 cm. Mild hydronephrosis and upper hydroureter, similar
to comparison. No obstructive process is visualized; patient had
obstructing ureteral calculus on previous imaging.

Left Kidney:

Length: 11.5 cm. Echogenicity within normal limits. No mass or
hydronephrosis visualized.

Bladder:

Not evaluated due to collapsed appearance. Ureteral jets were not
evaluated.
IMPRESSION: Mild right hydronephrosis, similar to 06/05/2015 CT.

## 2017-07-11 DIAGNOSIS — L659 Nonscarring hair loss, unspecified: Secondary | ICD-10-CM | POA: Diagnosis not present

## 2017-07-11 DIAGNOSIS — F900 Attention-deficit hyperactivity disorder, predominantly inattentive type: Secondary | ICD-10-CM | POA: Diagnosis not present

## 2017-07-11 DIAGNOSIS — J020 Streptococcal pharyngitis: Secondary | ICD-10-CM | POA: Diagnosis not present

## 2017-07-12 ENCOUNTER — Other Ambulatory Visit: Payer: BLUE CROSS/BLUE SHIELD

## 2017-08-07 DIAGNOSIS — Z01419 Encounter for gynecological examination (general) (routine) without abnormal findings: Secondary | ICD-10-CM | POA: Diagnosis not present

## 2017-08-07 DIAGNOSIS — Z6824 Body mass index (BMI) 24.0-24.9, adult: Secondary | ICD-10-CM | POA: Diagnosis not present

## 2017-10-18 DIAGNOSIS — G43909 Migraine, unspecified, not intractable, without status migrainosus: Secondary | ICD-10-CM | POA: Diagnosis not present

## 2017-10-18 DIAGNOSIS — R002 Palpitations: Secondary | ICD-10-CM | POA: Diagnosis not present

## 2017-10-18 DIAGNOSIS — F900 Attention-deficit hyperactivity disorder, predominantly inattentive type: Secondary | ICD-10-CM | POA: Diagnosis not present

## 2017-10-18 DIAGNOSIS — R5382 Chronic fatigue, unspecified: Secondary | ICD-10-CM | POA: Diagnosis not present

## 2018-01-23 DIAGNOSIS — E282 Polycystic ovarian syndrome: Secondary | ICD-10-CM | POA: Diagnosis not present

## 2018-02-13 DIAGNOSIS — L709 Acne, unspecified: Secondary | ICD-10-CM | POA: Diagnosis not present

## 2018-02-13 DIAGNOSIS — E282 Polycystic ovarian syndrome: Secondary | ICD-10-CM | POA: Diagnosis not present

## 2018-02-21 ENCOUNTER — Ambulatory Visit: Payer: BC Managed Care – PPO | Admitting: Registered"

## 2018-02-27 DIAGNOSIS — R635 Abnormal weight gain: Secondary | ICD-10-CM | POA: Diagnosis not present

## 2018-02-27 DIAGNOSIS — E282 Polycystic ovarian syndrome: Secondary | ICD-10-CM | POA: Diagnosis not present

## 2018-03-14 DIAGNOSIS — F419 Anxiety disorder, unspecified: Secondary | ICD-10-CM | POA: Diagnosis not present

## 2018-03-14 DIAGNOSIS — F900 Attention-deficit hyperactivity disorder, predominantly inattentive type: Secondary | ICD-10-CM | POA: Diagnosis not present

## 2018-03-14 DIAGNOSIS — G43909 Migraine, unspecified, not intractable, without status migrainosus: Secondary | ICD-10-CM | POA: Diagnosis not present

## 2018-04-01 DIAGNOSIS — J069 Acute upper respiratory infection, unspecified: Secondary | ICD-10-CM | POA: Diagnosis not present

## 2018-04-01 DIAGNOSIS — J029 Acute pharyngitis, unspecified: Secondary | ICD-10-CM | POA: Diagnosis not present

## 2018-06-26 DIAGNOSIS — F3341 Major depressive disorder, recurrent, in partial remission: Secondary | ICD-10-CM | POA: Diagnosis not present

## 2018-06-26 DIAGNOSIS — F900 Attention-deficit hyperactivity disorder, predominantly inattentive type: Secondary | ICD-10-CM | POA: Diagnosis not present

## 2018-06-26 DIAGNOSIS — F419 Anxiety disorder, unspecified: Secondary | ICD-10-CM | POA: Diagnosis not present

## 2018-07-13 ENCOUNTER — Other Ambulatory Visit: Payer: Self-pay | Admitting: Neurology

## 2018-10-31 DIAGNOSIS — F900 Attention-deficit hyperactivity disorder, predominantly inattentive type: Secondary | ICD-10-CM | POA: Diagnosis not present

## 2018-10-31 DIAGNOSIS — F3341 Major depressive disorder, recurrent, in partial remission: Secondary | ICD-10-CM | POA: Diagnosis not present

## 2018-10-31 DIAGNOSIS — F419 Anxiety disorder, unspecified: Secondary | ICD-10-CM | POA: Diagnosis not present

## 2018-12-04 DIAGNOSIS — F3341 Major depressive disorder, recurrent, in partial remission: Secondary | ICD-10-CM | POA: Diagnosis not present

## 2018-12-04 DIAGNOSIS — F900 Attention-deficit hyperactivity disorder, predominantly inattentive type: Secondary | ICD-10-CM | POA: Diagnosis not present

## 2018-12-04 DIAGNOSIS — F419 Anxiety disorder, unspecified: Secondary | ICD-10-CM | POA: Diagnosis not present

## 2019-01-04 DIAGNOSIS — F419 Anxiety disorder, unspecified: Secondary | ICD-10-CM | POA: Diagnosis not present

## 2019-01-04 DIAGNOSIS — F3341 Major depressive disorder, recurrent, in partial remission: Secondary | ICD-10-CM | POA: Diagnosis not present

## 2019-01-04 DIAGNOSIS — F900 Attention-deficit hyperactivity disorder, predominantly inattentive type: Secondary | ICD-10-CM | POA: Diagnosis not present

## 2019-02-11 DIAGNOSIS — M5417 Radiculopathy, lumbosacral region: Secondary | ICD-10-CM | POA: Diagnosis not present

## 2019-02-11 DIAGNOSIS — M791 Myalgia, unspecified site: Secondary | ICD-10-CM | POA: Diagnosis not present

## 2019-02-11 DIAGNOSIS — M545 Low back pain: Secondary | ICD-10-CM | POA: Diagnosis not present

## 2019-02-11 DIAGNOSIS — R51 Headache: Secondary | ICD-10-CM | POA: Diagnosis not present

## 2019-02-26 DIAGNOSIS — L659 Nonscarring hair loss, unspecified: Secondary | ICD-10-CM | POA: Diagnosis not present

## 2019-02-26 DIAGNOSIS — F3341 Major depressive disorder, recurrent, in partial remission: Secondary | ICD-10-CM | POA: Diagnosis not present

## 2019-02-26 DIAGNOSIS — F900 Attention-deficit hyperactivity disorder, predominantly inattentive type: Secondary | ICD-10-CM | POA: Diagnosis not present

## 2019-02-26 DIAGNOSIS — R632 Polyphagia: Secondary | ICD-10-CM | POA: Diagnosis not present

## 2019-03-03 DIAGNOSIS — R197 Diarrhea, unspecified: Secondary | ICD-10-CM | POA: Diagnosis not present

## 2019-03-15 DIAGNOSIS — K1370 Unspecified lesions of oral mucosa: Secondary | ICD-10-CM | POA: Diagnosis not present

## 2019-05-01 DIAGNOSIS — K591 Functional diarrhea: Secondary | ICD-10-CM | POA: Diagnosis not present

## 2019-06-26 DIAGNOSIS — M9903 Segmental and somatic dysfunction of lumbar region: Secondary | ICD-10-CM | POA: Diagnosis not present

## 2019-06-26 DIAGNOSIS — M5416 Radiculopathy, lumbar region: Secondary | ICD-10-CM | POA: Diagnosis not present

## 2019-06-26 DIAGNOSIS — M4126 Other idiopathic scoliosis, lumbar region: Secondary | ICD-10-CM | POA: Diagnosis not present

## 2019-06-26 DIAGNOSIS — M9904 Segmental and somatic dysfunction of sacral region: Secondary | ICD-10-CM | POA: Diagnosis not present

## 2019-06-27 DIAGNOSIS — M4126 Other idiopathic scoliosis, lumbar region: Secondary | ICD-10-CM | POA: Diagnosis not present

## 2019-06-27 DIAGNOSIS — M9903 Segmental and somatic dysfunction of lumbar region: Secondary | ICD-10-CM | POA: Diagnosis not present

## 2019-06-27 DIAGNOSIS — M9904 Segmental and somatic dysfunction of sacral region: Secondary | ICD-10-CM | POA: Diagnosis not present

## 2019-06-27 DIAGNOSIS — M5416 Radiculopathy, lumbar region: Secondary | ICD-10-CM | POA: Diagnosis not present

## 2019-07-01 DIAGNOSIS — M9904 Segmental and somatic dysfunction of sacral region: Secondary | ICD-10-CM | POA: Diagnosis not present

## 2019-07-01 DIAGNOSIS — M9903 Segmental and somatic dysfunction of lumbar region: Secondary | ICD-10-CM | POA: Diagnosis not present

## 2019-07-01 DIAGNOSIS — M4126 Other idiopathic scoliosis, lumbar region: Secondary | ICD-10-CM | POA: Diagnosis not present

## 2019-07-01 DIAGNOSIS — M5416 Radiculopathy, lumbar region: Secondary | ICD-10-CM | POA: Diagnosis not present

## 2019-07-03 DIAGNOSIS — M5416 Radiculopathy, lumbar region: Secondary | ICD-10-CM | POA: Diagnosis not present

## 2019-07-03 DIAGNOSIS — M4126 Other idiopathic scoliosis, lumbar region: Secondary | ICD-10-CM | POA: Diagnosis not present

## 2019-07-03 DIAGNOSIS — M9903 Segmental and somatic dysfunction of lumbar region: Secondary | ICD-10-CM | POA: Diagnosis not present

## 2019-07-03 DIAGNOSIS — M9904 Segmental and somatic dysfunction of sacral region: Secondary | ICD-10-CM | POA: Diagnosis not present

## 2019-07-04 DIAGNOSIS — M5416 Radiculopathy, lumbar region: Secondary | ICD-10-CM | POA: Diagnosis not present

## 2019-07-04 DIAGNOSIS — M9904 Segmental and somatic dysfunction of sacral region: Secondary | ICD-10-CM | POA: Diagnosis not present

## 2019-07-04 DIAGNOSIS — M9903 Segmental and somatic dysfunction of lumbar region: Secondary | ICD-10-CM | POA: Diagnosis not present

## 2019-07-04 DIAGNOSIS — M4126 Other idiopathic scoliosis, lumbar region: Secondary | ICD-10-CM | POA: Diagnosis not present

## 2019-07-08 DIAGNOSIS — Z03818 Encounter for observation for suspected exposure to other biological agents ruled out: Secondary | ICD-10-CM | POA: Diagnosis not present

## 2019-07-08 DIAGNOSIS — M9903 Segmental and somatic dysfunction of lumbar region: Secondary | ICD-10-CM | POA: Diagnosis not present

## 2019-07-08 DIAGNOSIS — M4126 Other idiopathic scoliosis, lumbar region: Secondary | ICD-10-CM | POA: Diagnosis not present

## 2019-07-08 DIAGNOSIS — M5416 Radiculopathy, lumbar region: Secondary | ICD-10-CM | POA: Diagnosis not present

## 2019-07-08 DIAGNOSIS — M9904 Segmental and somatic dysfunction of sacral region: Secondary | ICD-10-CM | POA: Diagnosis not present

## 2019-07-08 DIAGNOSIS — Z20828 Contact with and (suspected) exposure to other viral communicable diseases: Secondary | ICD-10-CM | POA: Diagnosis not present

## 2019-07-10 DIAGNOSIS — M9903 Segmental and somatic dysfunction of lumbar region: Secondary | ICD-10-CM | POA: Diagnosis not present

## 2019-07-10 DIAGNOSIS — M4126 Other idiopathic scoliosis, lumbar region: Secondary | ICD-10-CM | POA: Diagnosis not present

## 2019-07-10 DIAGNOSIS — M5416 Radiculopathy, lumbar region: Secondary | ICD-10-CM | POA: Diagnosis not present

## 2019-07-10 DIAGNOSIS — M9904 Segmental and somatic dysfunction of sacral region: Secondary | ICD-10-CM | POA: Diagnosis not present

## 2019-07-11 DIAGNOSIS — M4126 Other idiopathic scoliosis, lumbar region: Secondary | ICD-10-CM | POA: Diagnosis not present

## 2019-07-11 DIAGNOSIS — M5416 Radiculopathy, lumbar region: Secondary | ICD-10-CM | POA: Diagnosis not present

## 2019-07-11 DIAGNOSIS — M9904 Segmental and somatic dysfunction of sacral region: Secondary | ICD-10-CM | POA: Diagnosis not present

## 2019-07-11 DIAGNOSIS — M9903 Segmental and somatic dysfunction of lumbar region: Secondary | ICD-10-CM | POA: Diagnosis not present

## 2019-07-15 DIAGNOSIS — M5416 Radiculopathy, lumbar region: Secondary | ICD-10-CM | POA: Diagnosis not present

## 2019-07-15 DIAGNOSIS — M9903 Segmental and somatic dysfunction of lumbar region: Secondary | ICD-10-CM | POA: Diagnosis not present

## 2019-07-15 DIAGNOSIS — M4126 Other idiopathic scoliosis, lumbar region: Secondary | ICD-10-CM | POA: Diagnosis not present

## 2019-07-15 DIAGNOSIS — M9904 Segmental and somatic dysfunction of sacral region: Secondary | ICD-10-CM | POA: Diagnosis not present

## 2019-07-18 DIAGNOSIS — M9904 Segmental and somatic dysfunction of sacral region: Secondary | ICD-10-CM | POA: Diagnosis not present

## 2019-07-18 DIAGNOSIS — M4126 Other idiopathic scoliosis, lumbar region: Secondary | ICD-10-CM | POA: Diagnosis not present

## 2019-07-18 DIAGNOSIS — M9903 Segmental and somatic dysfunction of lumbar region: Secondary | ICD-10-CM | POA: Diagnosis not present

## 2019-07-18 DIAGNOSIS — M5416 Radiculopathy, lumbar region: Secondary | ICD-10-CM | POA: Diagnosis not present

## 2019-07-22 DIAGNOSIS — M4126 Other idiopathic scoliosis, lumbar region: Secondary | ICD-10-CM | POA: Diagnosis not present

## 2019-07-22 DIAGNOSIS — M9903 Segmental and somatic dysfunction of lumbar region: Secondary | ICD-10-CM | POA: Diagnosis not present

## 2019-07-22 DIAGNOSIS — M9904 Segmental and somatic dysfunction of sacral region: Secondary | ICD-10-CM | POA: Diagnosis not present

## 2019-07-22 DIAGNOSIS — M5416 Radiculopathy, lumbar region: Secondary | ICD-10-CM | POA: Diagnosis not present

## 2019-07-24 DIAGNOSIS — M4126 Other idiopathic scoliosis, lumbar region: Secondary | ICD-10-CM | POA: Diagnosis not present

## 2019-07-24 DIAGNOSIS — M5416 Radiculopathy, lumbar region: Secondary | ICD-10-CM | POA: Diagnosis not present

## 2019-07-24 DIAGNOSIS — M9903 Segmental and somatic dysfunction of lumbar region: Secondary | ICD-10-CM | POA: Diagnosis not present

## 2019-07-24 DIAGNOSIS — M9904 Segmental and somatic dysfunction of sacral region: Secondary | ICD-10-CM | POA: Diagnosis not present

## 2019-07-25 DIAGNOSIS — M9903 Segmental and somatic dysfunction of lumbar region: Secondary | ICD-10-CM | POA: Diagnosis not present

## 2019-07-25 DIAGNOSIS — M9904 Segmental and somatic dysfunction of sacral region: Secondary | ICD-10-CM | POA: Diagnosis not present

## 2019-07-25 DIAGNOSIS — M4126 Other idiopathic scoliosis, lumbar region: Secondary | ICD-10-CM | POA: Diagnosis not present

## 2019-07-25 DIAGNOSIS — M5416 Radiculopathy, lumbar region: Secondary | ICD-10-CM | POA: Diagnosis not present

## 2019-07-29 DIAGNOSIS — M5416 Radiculopathy, lumbar region: Secondary | ICD-10-CM | POA: Diagnosis not present

## 2019-07-29 DIAGNOSIS — M4126 Other idiopathic scoliosis, lumbar region: Secondary | ICD-10-CM | POA: Diagnosis not present

## 2019-07-29 DIAGNOSIS — M9904 Segmental and somatic dysfunction of sacral region: Secondary | ICD-10-CM | POA: Diagnosis not present

## 2019-07-29 DIAGNOSIS — M9903 Segmental and somatic dysfunction of lumbar region: Secondary | ICD-10-CM | POA: Diagnosis not present

## 2019-07-31 DIAGNOSIS — M5416 Radiculopathy, lumbar region: Secondary | ICD-10-CM | POA: Diagnosis not present

## 2019-07-31 DIAGNOSIS — M9903 Segmental and somatic dysfunction of lumbar region: Secondary | ICD-10-CM | POA: Diagnosis not present

## 2019-07-31 DIAGNOSIS — M4126 Other idiopathic scoliosis, lumbar region: Secondary | ICD-10-CM | POA: Diagnosis not present

## 2019-07-31 DIAGNOSIS — M9904 Segmental and somatic dysfunction of sacral region: Secondary | ICD-10-CM | POA: Diagnosis not present

## 2019-08-01 DIAGNOSIS — M9904 Segmental and somatic dysfunction of sacral region: Secondary | ICD-10-CM | POA: Diagnosis not present

## 2019-08-01 DIAGNOSIS — M9903 Segmental and somatic dysfunction of lumbar region: Secondary | ICD-10-CM | POA: Diagnosis not present

## 2019-08-01 DIAGNOSIS — M4126 Other idiopathic scoliosis, lumbar region: Secondary | ICD-10-CM | POA: Diagnosis not present

## 2019-08-01 DIAGNOSIS — M5416 Radiculopathy, lumbar region: Secondary | ICD-10-CM | POA: Diagnosis not present

## 2019-08-05 DIAGNOSIS — M9904 Segmental and somatic dysfunction of sacral region: Secondary | ICD-10-CM | POA: Diagnosis not present

## 2019-08-05 DIAGNOSIS — M9903 Segmental and somatic dysfunction of lumbar region: Secondary | ICD-10-CM | POA: Diagnosis not present

## 2019-08-05 DIAGNOSIS — M5416 Radiculopathy, lumbar region: Secondary | ICD-10-CM | POA: Diagnosis not present

## 2019-08-05 DIAGNOSIS — M4126 Other idiopathic scoliosis, lumbar region: Secondary | ICD-10-CM | POA: Diagnosis not present

## 2019-08-07 DIAGNOSIS — M4126 Other idiopathic scoliosis, lumbar region: Secondary | ICD-10-CM | POA: Diagnosis not present

## 2019-08-07 DIAGNOSIS — M9903 Segmental and somatic dysfunction of lumbar region: Secondary | ICD-10-CM | POA: Diagnosis not present

## 2019-08-07 DIAGNOSIS — M9904 Segmental and somatic dysfunction of sacral region: Secondary | ICD-10-CM | POA: Diagnosis not present

## 2019-08-07 DIAGNOSIS — M5416 Radiculopathy, lumbar region: Secondary | ICD-10-CM | POA: Diagnosis not present

## 2019-08-12 DIAGNOSIS — M9904 Segmental and somatic dysfunction of sacral region: Secondary | ICD-10-CM | POA: Diagnosis not present

## 2019-08-12 DIAGNOSIS — M4126 Other idiopathic scoliosis, lumbar region: Secondary | ICD-10-CM | POA: Diagnosis not present

## 2019-08-12 DIAGNOSIS — M9903 Segmental and somatic dysfunction of lumbar region: Secondary | ICD-10-CM | POA: Diagnosis not present

## 2019-08-12 DIAGNOSIS — M5416 Radiculopathy, lumbar region: Secondary | ICD-10-CM | POA: Diagnosis not present

## 2019-08-15 DIAGNOSIS — M9904 Segmental and somatic dysfunction of sacral region: Secondary | ICD-10-CM | POA: Diagnosis not present

## 2019-08-15 DIAGNOSIS — M5416 Radiculopathy, lumbar region: Secondary | ICD-10-CM | POA: Diagnosis not present

## 2019-08-15 DIAGNOSIS — M4126 Other idiopathic scoliosis, lumbar region: Secondary | ICD-10-CM | POA: Diagnosis not present

## 2019-08-15 DIAGNOSIS — M9903 Segmental and somatic dysfunction of lumbar region: Secondary | ICD-10-CM | POA: Diagnosis not present

## 2019-08-19 DIAGNOSIS — M4126 Other idiopathic scoliosis, lumbar region: Secondary | ICD-10-CM | POA: Diagnosis not present

## 2019-08-19 DIAGNOSIS — M9904 Segmental and somatic dysfunction of sacral region: Secondary | ICD-10-CM | POA: Diagnosis not present

## 2019-08-19 DIAGNOSIS — M9903 Segmental and somatic dysfunction of lumbar region: Secondary | ICD-10-CM | POA: Diagnosis not present

## 2019-08-19 DIAGNOSIS — M5416 Radiculopathy, lumbar region: Secondary | ICD-10-CM | POA: Diagnosis not present

## 2019-08-22 DIAGNOSIS — M5416 Radiculopathy, lumbar region: Secondary | ICD-10-CM | POA: Diagnosis not present

## 2019-08-22 DIAGNOSIS — M9904 Segmental and somatic dysfunction of sacral region: Secondary | ICD-10-CM | POA: Diagnosis not present

## 2019-08-22 DIAGNOSIS — M4126 Other idiopathic scoliosis, lumbar region: Secondary | ICD-10-CM | POA: Diagnosis not present

## 2019-08-22 DIAGNOSIS — M9903 Segmental and somatic dysfunction of lumbar region: Secondary | ICD-10-CM | POA: Diagnosis not present

## 2019-08-26 DIAGNOSIS — M5416 Radiculopathy, lumbar region: Secondary | ICD-10-CM | POA: Diagnosis not present

## 2019-08-26 DIAGNOSIS — M9903 Segmental and somatic dysfunction of lumbar region: Secondary | ICD-10-CM | POA: Diagnosis not present

## 2019-08-26 DIAGNOSIS — M9904 Segmental and somatic dysfunction of sacral region: Secondary | ICD-10-CM | POA: Diagnosis not present

## 2019-08-26 DIAGNOSIS — M4126 Other idiopathic scoliosis, lumbar region: Secondary | ICD-10-CM | POA: Diagnosis not present

## 2019-08-29 DIAGNOSIS — M9903 Segmental and somatic dysfunction of lumbar region: Secondary | ICD-10-CM | POA: Diagnosis not present

## 2019-08-29 DIAGNOSIS — M9904 Segmental and somatic dysfunction of sacral region: Secondary | ICD-10-CM | POA: Diagnosis not present

## 2019-08-29 DIAGNOSIS — M5416 Radiculopathy, lumbar region: Secondary | ICD-10-CM | POA: Diagnosis not present

## 2019-08-29 DIAGNOSIS — M4126 Other idiopathic scoliosis, lumbar region: Secondary | ICD-10-CM | POA: Diagnosis not present

## 2019-09-02 DIAGNOSIS — M9904 Segmental and somatic dysfunction of sacral region: Secondary | ICD-10-CM | POA: Diagnosis not present

## 2019-09-02 DIAGNOSIS — M4126 Other idiopathic scoliosis, lumbar region: Secondary | ICD-10-CM | POA: Diagnosis not present

## 2019-09-02 DIAGNOSIS — M5416 Radiculopathy, lumbar region: Secondary | ICD-10-CM | POA: Diagnosis not present

## 2019-09-02 DIAGNOSIS — M9903 Segmental and somatic dysfunction of lumbar region: Secondary | ICD-10-CM | POA: Diagnosis not present

## 2019-11-05 DIAGNOSIS — F419 Anxiety disorder, unspecified: Secondary | ICD-10-CM | POA: Diagnosis not present

## 2019-11-05 DIAGNOSIS — B001 Herpesviral vesicular dermatitis: Secondary | ICD-10-CM | POA: Diagnosis not present

## 2019-11-05 DIAGNOSIS — F3341 Major depressive disorder, recurrent, in partial remission: Secondary | ICD-10-CM | POA: Diagnosis not present

## 2019-11-06 DIAGNOSIS — Z6826 Body mass index (BMI) 26.0-26.9, adult: Secondary | ICD-10-CM | POA: Diagnosis not present

## 2019-11-06 DIAGNOSIS — Z01419 Encounter for gynecological examination (general) (routine) without abnormal findings: Secondary | ICD-10-CM | POA: Diagnosis not present

## 2019-11-06 DIAGNOSIS — Z3040 Encounter for surveillance of contraceptives, unspecified: Secondary | ICD-10-CM | POA: Diagnosis not present

## 2019-11-21 DIAGNOSIS — R87619 Unspecified abnormal cytological findings in specimens from cervix uteri: Secondary | ICD-10-CM | POA: Diagnosis not present

## 2019-11-21 DIAGNOSIS — Z3202 Encounter for pregnancy test, result negative: Secondary | ICD-10-CM | POA: Diagnosis not present

## 2019-11-21 DIAGNOSIS — A630 Anogenital (venereal) warts: Secondary | ICD-10-CM | POA: Diagnosis not present

## 2019-11-29 DIAGNOSIS — B001 Herpesviral vesicular dermatitis: Secondary | ICD-10-CM | POA: Diagnosis not present

## 2019-11-29 DIAGNOSIS — E282 Polycystic ovarian syndrome: Secondary | ICD-10-CM | POA: Diagnosis not present

## 2019-11-29 DIAGNOSIS — L659 Nonscarring hair loss, unspecified: Secondary | ICD-10-CM | POA: Diagnosis not present

## 2019-11-29 DIAGNOSIS — L709 Acne, unspecified: Secondary | ICD-10-CM | POA: Diagnosis not present

## 2019-11-29 DIAGNOSIS — E611 Iron deficiency: Secondary | ICD-10-CM | POA: Diagnosis not present

## 2020-06-01 DIAGNOSIS — L709 Acne, unspecified: Secondary | ICD-10-CM | POA: Diagnosis not present

## 2020-06-01 DIAGNOSIS — L659 Nonscarring hair loss, unspecified: Secondary | ICD-10-CM | POA: Diagnosis not present

## 2020-06-01 DIAGNOSIS — F419 Anxiety disorder, unspecified: Secondary | ICD-10-CM | POA: Diagnosis not present

## 2020-09-21 DIAGNOSIS — L700 Acne vulgaris: Secondary | ICD-10-CM | POA: Diagnosis not present

## 2020-10-07 DIAGNOSIS — L638 Other alopecia areata: Secondary | ICD-10-CM | POA: Diagnosis not present

## 2020-10-27 DIAGNOSIS — R7989 Other specified abnormal findings of blood chemistry: Secondary | ICD-10-CM | POA: Diagnosis not present

## 2020-10-27 DIAGNOSIS — E282 Polycystic ovarian syndrome: Secondary | ICD-10-CM | POA: Diagnosis not present

## 2020-10-27 DIAGNOSIS — L659 Nonscarring hair loss, unspecified: Secondary | ICD-10-CM | POA: Diagnosis not present

## 2020-11-26 DIAGNOSIS — L638 Other alopecia areata: Secondary | ICD-10-CM | POA: Diagnosis not present

## 2020-12-16 DIAGNOSIS — J302 Other seasonal allergic rhinitis: Secondary | ICD-10-CM | POA: Diagnosis not present

## 2020-12-16 DIAGNOSIS — Z91018 Allergy to other foods: Secondary | ICD-10-CM | POA: Diagnosis not present

## 2020-12-16 DIAGNOSIS — L639 Alopecia areata, unspecified: Secondary | ICD-10-CM | POA: Diagnosis not present

## 2020-12-16 DIAGNOSIS — R0602 Shortness of breath: Secondary | ICD-10-CM | POA: Diagnosis not present

## 2020-12-28 DIAGNOSIS — Z01419 Encounter for gynecological examination (general) (routine) without abnormal findings: Secondary | ICD-10-CM | POA: Diagnosis not present

## 2020-12-28 DIAGNOSIS — Z6824 Body mass index (BMI) 24.0-24.9, adult: Secondary | ICD-10-CM | POA: Diagnosis not present

## 2020-12-29 DIAGNOSIS — Z01419 Encounter for gynecological examination (general) (routine) without abnormal findings: Secondary | ICD-10-CM | POA: Diagnosis not present

## 2021-03-09 DIAGNOSIS — L709 Acne, unspecified: Secondary | ICD-10-CM | POA: Diagnosis not present

## 2021-03-09 DIAGNOSIS — F419 Anxiety disorder, unspecified: Secondary | ICD-10-CM | POA: Diagnosis not present

## 2021-03-09 DIAGNOSIS — F3341 Major depressive disorder, recurrent, in partial remission: Secondary | ICD-10-CM | POA: Diagnosis not present

## 2021-04-06 DIAGNOSIS — H52221 Regular astigmatism, right eye: Secondary | ICD-10-CM | POA: Diagnosis not present

## 2021-08-12 DIAGNOSIS — F4322 Adjustment disorder with anxiety: Secondary | ICD-10-CM | POA: Diagnosis not present

## 2021-08-19 DIAGNOSIS — F4322 Adjustment disorder with anxiety: Secondary | ICD-10-CM | POA: Diagnosis not present

## 2021-08-26 DIAGNOSIS — F4322 Adjustment disorder with anxiety: Secondary | ICD-10-CM | POA: Diagnosis not present

## 2021-08-30 DIAGNOSIS — F4322 Adjustment disorder with anxiety: Secondary | ICD-10-CM | POA: Diagnosis not present

## 2021-09-23 DIAGNOSIS — F4322 Adjustment disorder with anxiety: Secondary | ICD-10-CM | POA: Diagnosis not present

## 2021-10-07 DIAGNOSIS — F4322 Adjustment disorder with anxiety: Secondary | ICD-10-CM | POA: Diagnosis not present

## 2021-10-14 DIAGNOSIS — D485 Neoplasm of uncertain behavior of skin: Secondary | ICD-10-CM | POA: Diagnosis not present

## 2021-10-21 DIAGNOSIS — F4322 Adjustment disorder with anxiety: Secondary | ICD-10-CM | POA: Diagnosis not present

## 2021-10-22 DIAGNOSIS — L650 Telogen effluvium: Secondary | ICD-10-CM | POA: Diagnosis not present

## 2021-10-28 DIAGNOSIS — L650 Telogen effluvium: Secondary | ICD-10-CM | POA: Diagnosis not present

## 2021-11-04 DIAGNOSIS — L658 Other specified nonscarring hair loss: Secondary | ICD-10-CM | POA: Diagnosis not present

## 2021-11-04 DIAGNOSIS — F4322 Adjustment disorder with anxiety: Secondary | ICD-10-CM | POA: Diagnosis not present

## 2021-11-04 DIAGNOSIS — L650 Telogen effluvium: Secondary | ICD-10-CM | POA: Diagnosis not present

## 2021-11-04 DIAGNOSIS — L219 Seborrheic dermatitis, unspecified: Secondary | ICD-10-CM | POA: Diagnosis not present

## 2021-11-18 DIAGNOSIS — F4322 Adjustment disorder with anxiety: Secondary | ICD-10-CM | POA: Diagnosis not present

## 2021-12-16 DIAGNOSIS — M792 Neuralgia and neuritis, unspecified: Secondary | ICD-10-CM | POA: Diagnosis not present

## 2021-12-16 DIAGNOSIS — B070 Plantar wart: Secondary | ICD-10-CM | POA: Diagnosis not present

## 2022-01-07 DIAGNOSIS — B070 Plantar wart: Secondary | ICD-10-CM | POA: Diagnosis not present

## 2022-04-07 DIAGNOSIS — L42 Pityriasis rosea: Secondary | ICD-10-CM | POA: Diagnosis not present

## 2022-04-13 DIAGNOSIS — M2569 Stiffness of other specified joint, not elsewhere classified: Secondary | ICD-10-CM | POA: Diagnosis not present

## 2022-04-13 DIAGNOSIS — M542 Cervicalgia: Secondary | ICD-10-CM | POA: Diagnosis not present

## 2022-04-13 DIAGNOSIS — M546 Pain in thoracic spine: Secondary | ICD-10-CM | POA: Diagnosis not present

## 2022-04-13 DIAGNOSIS — M419 Scoliosis, unspecified: Secondary | ICD-10-CM | POA: Diagnosis not present

## 2022-04-19 DIAGNOSIS — M542 Cervicalgia: Secondary | ICD-10-CM | POA: Diagnosis not present

## 2022-04-19 DIAGNOSIS — M546 Pain in thoracic spine: Secondary | ICD-10-CM | POA: Diagnosis not present

## 2022-04-19 DIAGNOSIS — M419 Scoliosis, unspecified: Secondary | ICD-10-CM | POA: Diagnosis not present

## 2022-04-19 DIAGNOSIS — M2569 Stiffness of other specified joint, not elsewhere classified: Secondary | ICD-10-CM | POA: Diagnosis not present

## 2022-04-21 DIAGNOSIS — M419 Scoliosis, unspecified: Secondary | ICD-10-CM | POA: Diagnosis not present

## 2022-04-21 DIAGNOSIS — M546 Pain in thoracic spine: Secondary | ICD-10-CM | POA: Diagnosis not present

## 2022-04-21 DIAGNOSIS — M542 Cervicalgia: Secondary | ICD-10-CM | POA: Diagnosis not present

## 2022-04-21 DIAGNOSIS — M2569 Stiffness of other specified joint, not elsewhere classified: Secondary | ICD-10-CM | POA: Diagnosis not present

## 2022-04-25 DIAGNOSIS — M2569 Stiffness of other specified joint, not elsewhere classified: Secondary | ICD-10-CM | POA: Diagnosis not present

## 2022-04-25 DIAGNOSIS — M542 Cervicalgia: Secondary | ICD-10-CM | POA: Diagnosis not present

## 2022-04-25 DIAGNOSIS — M546 Pain in thoracic spine: Secondary | ICD-10-CM | POA: Diagnosis not present

## 2022-04-25 DIAGNOSIS — M419 Scoliosis, unspecified: Secondary | ICD-10-CM | POA: Diagnosis not present

## 2022-04-27 DIAGNOSIS — M546 Pain in thoracic spine: Secondary | ICD-10-CM | POA: Diagnosis not present

## 2022-04-27 DIAGNOSIS — M419 Scoliosis, unspecified: Secondary | ICD-10-CM | POA: Diagnosis not present

## 2022-04-27 DIAGNOSIS — M2569 Stiffness of other specified joint, not elsewhere classified: Secondary | ICD-10-CM | POA: Diagnosis not present

## 2022-04-27 DIAGNOSIS — M542 Cervicalgia: Secondary | ICD-10-CM | POA: Diagnosis not present

## 2022-05-02 DIAGNOSIS — M2569 Stiffness of other specified joint, not elsewhere classified: Secondary | ICD-10-CM | POA: Diagnosis not present

## 2022-05-02 DIAGNOSIS — M546 Pain in thoracic spine: Secondary | ICD-10-CM | POA: Diagnosis not present

## 2022-05-02 DIAGNOSIS — M542 Cervicalgia: Secondary | ICD-10-CM | POA: Diagnosis not present

## 2022-05-02 DIAGNOSIS — M419 Scoliosis, unspecified: Secondary | ICD-10-CM | POA: Diagnosis not present

## 2022-05-03 DIAGNOSIS — F419 Anxiety disorder, unspecified: Secondary | ICD-10-CM | POA: Diagnosis not present

## 2022-05-03 DIAGNOSIS — R0602 Shortness of breath: Secondary | ICD-10-CM | POA: Diagnosis not present

## 2022-05-04 DIAGNOSIS — M2569 Stiffness of other specified joint, not elsewhere classified: Secondary | ICD-10-CM | POA: Diagnosis not present

## 2022-05-04 DIAGNOSIS — M542 Cervicalgia: Secondary | ICD-10-CM | POA: Diagnosis not present

## 2022-05-04 DIAGNOSIS — M419 Scoliosis, unspecified: Secondary | ICD-10-CM | POA: Diagnosis not present

## 2022-05-04 DIAGNOSIS — M546 Pain in thoracic spine: Secondary | ICD-10-CM | POA: Diagnosis not present

## 2022-05-09 DIAGNOSIS — M2569 Stiffness of other specified joint, not elsewhere classified: Secondary | ICD-10-CM | POA: Diagnosis not present

## 2022-05-09 DIAGNOSIS — M542 Cervicalgia: Secondary | ICD-10-CM | POA: Diagnosis not present

## 2022-05-09 DIAGNOSIS — M419 Scoliosis, unspecified: Secondary | ICD-10-CM | POA: Diagnosis not present

## 2022-05-09 DIAGNOSIS — M546 Pain in thoracic spine: Secondary | ICD-10-CM | POA: Diagnosis not present

## 2022-05-11 DIAGNOSIS — M546 Pain in thoracic spine: Secondary | ICD-10-CM | POA: Diagnosis not present

## 2022-05-11 DIAGNOSIS — M2569 Stiffness of other specified joint, not elsewhere classified: Secondary | ICD-10-CM | POA: Diagnosis not present

## 2022-05-11 DIAGNOSIS — M419 Scoliosis, unspecified: Secondary | ICD-10-CM | POA: Diagnosis not present

## 2022-05-11 DIAGNOSIS — M542 Cervicalgia: Secondary | ICD-10-CM | POA: Diagnosis not present

## 2022-05-17 DIAGNOSIS — Z124 Encounter for screening for malignant neoplasm of cervix: Secondary | ICD-10-CM | POA: Diagnosis not present

## 2022-05-17 DIAGNOSIS — Z1151 Encounter for screening for human papillomavirus (HPV): Secondary | ICD-10-CM | POA: Diagnosis not present

## 2022-05-17 DIAGNOSIS — Z01419 Encounter for gynecological examination (general) (routine) without abnormal findings: Secondary | ICD-10-CM | POA: Diagnosis not present

## 2022-05-17 DIAGNOSIS — Z6824 Body mass index (BMI) 24.0-24.9, adult: Secondary | ICD-10-CM | POA: Diagnosis not present

## 2022-05-18 DIAGNOSIS — M542 Cervicalgia: Secondary | ICD-10-CM | POA: Diagnosis not present

## 2022-05-18 DIAGNOSIS — M419 Scoliosis, unspecified: Secondary | ICD-10-CM | POA: Diagnosis not present

## 2022-05-18 DIAGNOSIS — M546 Pain in thoracic spine: Secondary | ICD-10-CM | POA: Diagnosis not present

## 2022-05-18 DIAGNOSIS — M2569 Stiffness of other specified joint, not elsewhere classified: Secondary | ICD-10-CM | POA: Diagnosis not present

## 2022-05-25 DIAGNOSIS — M2569 Stiffness of other specified joint, not elsewhere classified: Secondary | ICD-10-CM | POA: Diagnosis not present

## 2022-05-25 DIAGNOSIS — M546 Pain in thoracic spine: Secondary | ICD-10-CM | POA: Diagnosis not present

## 2022-05-25 DIAGNOSIS — M419 Scoliosis, unspecified: Secondary | ICD-10-CM | POA: Diagnosis not present

## 2022-05-25 DIAGNOSIS — M542 Cervicalgia: Secondary | ICD-10-CM | POA: Diagnosis not present

## 2022-05-30 DIAGNOSIS — M546 Pain in thoracic spine: Secondary | ICD-10-CM | POA: Diagnosis not present

## 2022-05-30 DIAGNOSIS — M419 Scoliosis, unspecified: Secondary | ICD-10-CM | POA: Diagnosis not present

## 2022-05-30 DIAGNOSIS — M542 Cervicalgia: Secondary | ICD-10-CM | POA: Diagnosis not present

## 2022-05-30 DIAGNOSIS — M2569 Stiffness of other specified joint, not elsewhere classified: Secondary | ICD-10-CM | POA: Diagnosis not present

## 2022-06-01 DIAGNOSIS — M546 Pain in thoracic spine: Secondary | ICD-10-CM | POA: Diagnosis not present

## 2022-06-01 DIAGNOSIS — M542 Cervicalgia: Secondary | ICD-10-CM | POA: Diagnosis not present

## 2022-06-01 DIAGNOSIS — M2569 Stiffness of other specified joint, not elsewhere classified: Secondary | ICD-10-CM | POA: Diagnosis not present

## 2022-06-01 DIAGNOSIS — M419 Scoliosis, unspecified: Secondary | ICD-10-CM | POA: Diagnosis not present

## 2022-06-03 DIAGNOSIS — F419 Anxiety disorder, unspecified: Secondary | ICD-10-CM | POA: Diagnosis not present

## 2022-06-06 DIAGNOSIS — M419 Scoliosis, unspecified: Secondary | ICD-10-CM | POA: Diagnosis not present

## 2022-06-06 DIAGNOSIS — M546 Pain in thoracic spine: Secondary | ICD-10-CM | POA: Diagnosis not present

## 2022-06-06 DIAGNOSIS — M542 Cervicalgia: Secondary | ICD-10-CM | POA: Diagnosis not present

## 2022-06-06 DIAGNOSIS — M2569 Stiffness of other specified joint, not elsewhere classified: Secondary | ICD-10-CM | POA: Diagnosis not present

## 2022-06-13 DIAGNOSIS — M546 Pain in thoracic spine: Secondary | ICD-10-CM | POA: Diagnosis not present

## 2022-06-13 DIAGNOSIS — M542 Cervicalgia: Secondary | ICD-10-CM | POA: Diagnosis not present

## 2022-06-13 DIAGNOSIS — M2569 Stiffness of other specified joint, not elsewhere classified: Secondary | ICD-10-CM | POA: Diagnosis not present

## 2022-06-13 DIAGNOSIS — M419 Scoliosis, unspecified: Secondary | ICD-10-CM | POA: Diagnosis not present

## 2022-06-16 DIAGNOSIS — M545 Low back pain, unspecified: Secondary | ICD-10-CM | POA: Diagnosis not present

## 2022-06-16 DIAGNOSIS — M41126 Adolescent idiopathic scoliosis, lumbar region: Secondary | ICD-10-CM | POA: Diagnosis not present

## 2022-06-16 DIAGNOSIS — M7061 Trochanteric bursitis, right hip: Secondary | ICD-10-CM | POA: Diagnosis not present

## 2022-06-21 ENCOUNTER — Other Ambulatory Visit: Payer: Self-pay | Admitting: Orthopaedic Surgery

## 2022-06-21 DIAGNOSIS — M4126 Other idiopathic scoliosis, lumbar region: Secondary | ICD-10-CM

## 2022-06-24 DIAGNOSIS — M7061 Trochanteric bursitis, right hip: Secondary | ICD-10-CM | POA: Diagnosis not present

## 2022-06-24 DIAGNOSIS — M461 Sacroiliitis, not elsewhere classified: Secondary | ICD-10-CM | POA: Diagnosis not present

## 2022-06-24 DIAGNOSIS — M791 Myalgia, unspecified site: Secondary | ICD-10-CM | POA: Diagnosis not present

## 2022-06-24 DIAGNOSIS — Z6822 Body mass index (BMI) 22.0-22.9, adult: Secondary | ICD-10-CM | POA: Diagnosis not present

## 2022-07-08 DIAGNOSIS — D509 Iron deficiency anemia, unspecified: Secondary | ICD-10-CM | POA: Diagnosis not present

## 2022-07-08 DIAGNOSIS — F3341 Major depressive disorder, recurrent, in partial remission: Secondary | ICD-10-CM | POA: Diagnosis not present

## 2022-07-08 DIAGNOSIS — L709 Acne, unspecified: Secondary | ICD-10-CM | POA: Diagnosis not present

## 2022-07-08 DIAGNOSIS — G43909 Migraine, unspecified, not intractable, without status migrainosus: Secondary | ICD-10-CM | POA: Diagnosis not present

## 2022-07-11 DIAGNOSIS — M7061 Trochanteric bursitis, right hip: Secondary | ICD-10-CM | POA: Diagnosis not present

## 2022-07-11 DIAGNOSIS — M461 Sacroiliitis, not elsewhere classified: Secondary | ICD-10-CM | POA: Diagnosis not present

## 2022-07-22 DIAGNOSIS — F3341 Major depressive disorder, recurrent, in partial remission: Secondary | ICD-10-CM | POA: Diagnosis not present

## 2022-07-22 DIAGNOSIS — E538 Deficiency of other specified B group vitamins: Secondary | ICD-10-CM | POA: Diagnosis not present

## 2022-07-22 DIAGNOSIS — L659 Nonscarring hair loss, unspecified: Secondary | ICD-10-CM | POA: Diagnosis not present

## 2022-07-22 DIAGNOSIS — G43909 Migraine, unspecified, not intractable, without status migrainosus: Secondary | ICD-10-CM | POA: Diagnosis not present

## 2022-07-22 DIAGNOSIS — Z0000 Encounter for general adult medical examination without abnormal findings: Secondary | ICD-10-CM | POA: Diagnosis not present

## 2022-07-22 DIAGNOSIS — L709 Acne, unspecified: Secondary | ICD-10-CM | POA: Diagnosis not present

## 2022-07-22 DIAGNOSIS — E611 Iron deficiency: Secondary | ICD-10-CM | POA: Diagnosis not present

## 2022-07-26 DIAGNOSIS — M461 Sacroiliitis, not elsewhere classified: Secondary | ICD-10-CM | POA: Diagnosis not present

## 2023-02-20 ENCOUNTER — Other Ambulatory Visit: Payer: Self-pay | Admitting: Family

## 2023-02-20 ENCOUNTER — Encounter: Payer: Self-pay | Admitting: Family

## 2023-02-20 DIAGNOSIS — N6459 Other signs and symptoms in breast: Secondary | ICD-10-CM

## 2023-02-28 ENCOUNTER — Other Ambulatory Visit: Payer: Self-pay | Admitting: Family

## 2023-03-15 ENCOUNTER — Other Ambulatory Visit: Payer: BC Managed Care – PPO

## 2023-10-27 ENCOUNTER — Other Ambulatory Visit: Payer: Self-pay | Admitting: Endocrinology

## 2023-10-27 DIAGNOSIS — R221 Localized swelling, mass and lump, neck: Secondary | ICD-10-CM

## 2023-11-10 ENCOUNTER — Ambulatory Visit
Admission: RE | Admit: 2023-11-10 | Discharge: 2023-11-10 | Disposition: A | Discharge status: Home / Self Care | Payer: Self-pay | Source: Ambulatory Visit | Attending: Endocrinology | Admitting: Endocrinology

## 2023-11-10 DIAGNOSIS — R221 Localized swelling, mass and lump, neck: Secondary | ICD-10-CM

## 2023-11-10 MED ORDER — IOPAMIDOL (ISOVUE-300) INJECTION 61%
75.0000 mL | Freq: Once | INTRAVENOUS | Status: AC | PRN
  Administered 2023-11-10: 75 mL via INTRAVENOUS

## 2023-11-15 ENCOUNTER — Other Ambulatory Visit: Payer: Self-pay | Admitting: Family

## 2023-11-15 DIAGNOSIS — N644 Mastodynia: Secondary | ICD-10-CM

## 2023-11-15 DIAGNOSIS — N6459 Other signs and symptoms in breast: Secondary | ICD-10-CM

## 2023-11-21 ENCOUNTER — Ambulatory Visit
Admission: RE | Admit: 2023-11-21 | Discharge: 2023-11-21 | Disposition: A | Discharge status: Home / Self Care | Payer: Self-pay | Source: Ambulatory Visit | Attending: Family | Admitting: Family

## 2023-11-21 DIAGNOSIS — N644 Mastodynia: Secondary | ICD-10-CM
